# Patient Record
Sex: Female | Born: 1935 | Race: White | Hispanic: No | Smoking: Never smoker
Health system: Southern US, Community
[De-identification: ages and names within clinical notes are randomized; demographics above are authoritative.]

## PROBLEM LIST (undated history)

## (undated) DIAGNOSIS — J849 Interstitial pulmonary disease, unspecified: Secondary | ICD-10-CM

## (undated) DIAGNOSIS — R0989 Other specified symptoms and signs involving the circulatory and respiratory systems: Secondary | ICD-10-CM

## (undated) DIAGNOSIS — W19XXXA Unspecified fall, initial encounter: Secondary | ICD-10-CM

## (undated) DIAGNOSIS — I10 Essential (primary) hypertension: Secondary | ICD-10-CM

## (undated) DIAGNOSIS — R748 Abnormal levels of other serum enzymes: Secondary | ICD-10-CM

## (undated) DIAGNOSIS — E871 Hypo-osmolality and hyponatremia: Secondary | ICD-10-CM

## (undated) DIAGNOSIS — J449 Chronic obstructive pulmonary disease, unspecified: Secondary | ICD-10-CM

## (undated) DIAGNOSIS — S22000A Wedge compression fracture of unspecified thoracic vertebra, initial encounter for closed fracture: Secondary | ICD-10-CM

## (undated) DIAGNOSIS — R42 Dizziness and giddiness: Secondary | ICD-10-CM

## (undated) DIAGNOSIS — I350 Nonrheumatic aortic (valve) stenosis: Secondary | ICD-10-CM

## (undated) DIAGNOSIS — I951 Orthostatic hypotension: Secondary | ICD-10-CM

## (undated) HISTORY — PX: EYE SURGERY: SHX253

## (undated) HISTORY — PX: ABDOMINAL HYSTERECTOMY: SHX81

---

## 2001-12-04 ENCOUNTER — Encounter: Payer: Self-pay | Admitting: Emergency Medicine

## 2001-12-05 ENCOUNTER — Inpatient Hospital Stay (HOSPITAL_COMMUNITY): Admission: EM | Admit: 2001-12-05 | Discharge: 2001-12-10 | Payer: Self-pay | Admitting: Emergency Medicine

## 2003-12-08 DIAGNOSIS — J449 Chronic obstructive pulmonary disease, unspecified: Secondary | ICD-10-CM

## 2003-12-08 HISTORY — DX: Chronic obstructive pulmonary disease, unspecified: J44.9

## 2003-12-10 ENCOUNTER — Encounter: Admission: RE | Admit: 2003-12-10 | Discharge: 2003-12-10 | Payer: Self-pay | Admitting: Family Medicine

## 2005-07-17 ENCOUNTER — Encounter: Admission: RE | Admit: 2005-07-17 | Discharge: 2005-07-17 | Payer: Self-pay | Admitting: Family Medicine

## 2007-06-09 DIAGNOSIS — I350 Nonrheumatic aortic (valve) stenosis: Secondary | ICD-10-CM

## 2007-06-09 DIAGNOSIS — E860 Dehydration: Secondary | ICD-10-CM

## 2007-06-09 DIAGNOSIS — R748 Abnormal levels of other serum enzymes: Secondary | ICD-10-CM

## 2007-06-09 HISTORY — DX: Dehydration: E86.0

## 2007-06-09 HISTORY — DX: Abnormal levels of other serum enzymes: R74.8

## 2007-06-09 HISTORY — DX: Nonrheumatic aortic (valve) stenosis: I35.0

## 2007-06-17 ENCOUNTER — Inpatient Hospital Stay (HOSPITAL_COMMUNITY): Admission: EM | Admit: 2007-06-17 | Discharge: 2007-06-20 | Payer: Self-pay | Admitting: Emergency Medicine

## 2007-06-17 ENCOUNTER — Encounter (INDEPENDENT_AMBULATORY_CARE_PROVIDER_SITE_OTHER): Payer: Self-pay | Admitting: Internal Medicine

## 2010-01-25 ENCOUNTER — Inpatient Hospital Stay (HOSPITAL_COMMUNITY)
Admission: EM | Admit: 2010-01-25 | Discharge: 2010-01-27 | Payer: Self-pay | Source: Home / Self Care | Admitting: Emergency Medicine

## 2010-01-26 ENCOUNTER — Ambulatory Visit: Payer: Self-pay | Admitting: Vascular Surgery

## 2010-01-26 ENCOUNTER — Encounter (INDEPENDENT_AMBULATORY_CARE_PROVIDER_SITE_OTHER): Payer: Self-pay | Admitting: Internal Medicine

## 2010-09-08 ENCOUNTER — Other Ambulatory Visit: Payer: Self-pay

## 2010-09-23 LAB — COMPREHENSIVE METABOLIC PANEL
ALT: 18 U/L (ref 0–35)
AST: 22 U/L (ref 0–37)
Alkaline Phosphatase: 66 U/L (ref 39–117)
BUN: 8 mg/dL (ref 6–23)
CO2: 28 mEq/L (ref 19–32)
CO2: 28 mEq/L (ref 19–32)
Chloride: 92 mEq/L — ABNORMAL LOW (ref 96–112)
Creatinine, Ser: 0.47 mg/dL (ref 0.4–1.2)
Creatinine, Ser: 0.49 mg/dL (ref 0.4–1.2)
GFR calc Af Amer: >60 mL/min (ref >60)
GFR calc non Af Amer: >60 mL/min (ref >60)
Glucose, Bld: 148 mg/dL — ABNORMAL HIGH (ref 70–99)
Potassium: 3.7 mEq/L (ref 3.5–5.1)
Sodium: 127 mEq/L — ABNORMAL LOW (ref 135–145)
Total Bilirubin: 0.7 mg/dL (ref 0.3–1.2)
Total Bilirubin: 0.7 mg/dL (ref 0.3–1.2)
Total Protein: 6.6 g/dL (ref 6.0–8.3)
Total Protein: 7.1 g/dL (ref 6.0–8.3)

## 2010-09-23 LAB — CBC
HCT: 39.2 % (ref 36.0–46.0)
HCT: 41.5 % (ref 36.0–46.0)
Hemoglobin: 14.1 g/dL (ref 12.0–15.0)
MCHC: 34.6 g/dL (ref 30.0–36.0)
Platelets: 230 10*3/uL (ref 150–400)
RBC: 4.25 MIL/uL (ref 3.87–5.11)
RBC: 4.46 MIL/uL (ref 3.87–5.11)
RDW: 13.4 % (ref 11.5–15.5)
RDW: 13.9 % (ref 11.5–15.5)

## 2010-09-23 LAB — LIPID PANEL
Cholesterol: 134 mg/dL (ref 0–200)
LDL Cholesterol: 60 mg/dL (ref 0–99)
Total CHOL/HDL Ratio: 2 RATIO
Triglycerides: 29 mg/dL (ref <150)
VLDL: 6 mg/dL (ref 0–40)

## 2010-09-23 LAB — BASIC METABOLIC PANEL
BUN: 7 mg/dL (ref 6–23)
CO2: 30 mEq/L (ref 19–32)
Calcium: 9.2 mg/dL (ref 8.4–10.5)
Creatinine, Ser: 0.51 mg/dL (ref 0.4–1.2)
GFR calc non Af Amer: >60 mL/min (ref >60)
Glucose, Bld: 111 mg/dL — ABNORMAL HIGH (ref 70–99)

## 2010-09-23 LAB — OSMOLALITY, URINE: Osmolality, Ur: 225 mOsm/kg — ABNORMAL LOW (ref 390–1090)

## 2010-09-23 LAB — TSH: TSH: 1.283 u[IU]/mL (ref 0.350–4.500)

## 2010-09-23 LAB — DIFFERENTIAL
Eosinophils Absolute: 0.1 10*3/uL (ref 0.0–0.7)
Lymphs Abs: 1.4 10*3/uL (ref 0.7–4.0)
Monocytes Absolute: 0.6 10*3/uL (ref 0.1–1.0)

## 2010-09-23 LAB — PROTIME-INR
INR: 1.13 (ref 0.00–1.49)
Prothrombin Time: 14.4 seconds (ref 11.6–15.2)

## 2010-09-23 LAB — GLUCOSE, CAPILLARY
Glucose-Capillary: 121 mg/dL — ABNORMAL HIGH (ref 70–99)
Glucose-Capillary: 131 mg/dL — ABNORMAL HIGH (ref 70–99)
Glucose-Capillary: 131 mg/dL — ABNORMAL HIGH (ref 70–99)
Glucose-Capillary: 147 mg/dL — ABNORMAL HIGH (ref 70–99)
Glucose-Capillary: 98 mg/dL (ref 70–99)

## 2010-09-23 LAB — URINE CULTURE: Culture: NO GROWTH

## 2010-09-23 LAB — CARDIAC PANEL(CRET KIN+CKTOT+MB+TROPI)
CK, MB: 5 ng/mL — ABNORMAL HIGH (ref 0.3–4.0)
Relative Index: INVALID (ref 0.0–2.5)
Troponin I: 0.01 ng/mL (ref 0.00–0.06)

## 2010-09-23 LAB — HEMOGLOBIN A1C: Hgb A1c MFr Bld: 5.8 % — ABNORMAL HIGH (ref <5.7)

## 2010-09-23 LAB — URINALYSIS, ROUTINE W REFLEX MICROSCOPIC
Bilirubin Urine: NEGATIVE
Hgb urine dipstick: NEGATIVE
Hgb urine dipstick: NEGATIVE
Nitrite: NEGATIVE
Protein, ur: NEGATIVE mg/dL
Urobilinogen, UA: 0.2 mg/dL (ref 0.0–1.0)
pH: 6.5 (ref 5.0–8.0)

## 2010-09-23 LAB — POCT CARDIAC MARKERS: Myoglobin, poc: 80.1 ng/mL (ref 12–200)

## 2010-09-23 LAB — HOMOCYSTEINE: Homocysteine: 6.7 umol/L (ref 4.0–15.4)

## 2010-11-21 NOTE — H&P (Signed)
NAMEMANA, HABERL                   ACCOUNT NO.:  [id number]   MEDICAL RECORD NO.:  [id number]          PATIENT TYPE:  EMS   LOCATION:  ED                           FACILITY:  [facility]   PHYSICIAN:  Hollice Espy, M.D.DATE OF BIRTH:  1913/11/24   DATE OF ADMISSION:  06/17/2007  DATE OF DISCHARGE:                              HISTORY & PHYSICAL   PRIMARY CARE PHYSICIAN:  Jethro Bastos, M.D.   CHIEF COMPLAINT:  Fall.   HISTORY OF PRESENT ILLNESS:  The patient is a 77 year old white female  with past medical history of hypertension who has reported the last  these last few months having episodes of dizziness, almost on a daily  basis.  This is not vertigo, just more of a feeling of lightheadedness.  Today, her symptoms were recurring all day, and then in the evening she  simply just passed out, landing on the ground.  She awoke but had some  severe pain in her back, and so she underwent a CT scan of the head  which was negative, but her x-ray of her thoracic spine noted some age  indeterminate compression fractures at T7 and T9.  The patient had lab  work done.  She was found to have a potassium of 3.3, white count of  13.8 with a 79% shift and normal CPK and MB, but thought elevated  troponin was 0.07.  A urinalysis was ordered because of her elevated  white count but it is still pending.  Currently, the patient complains  of some back pain when she moves.  If she does not, she says it does not  feel bad.  She otherwise has no complaints.  She denies any headaches or  vision changes, dysphagia, chest pain, palpitations, shortness of  breath, wheezing, coughing, no abdominal pain.  No hematuria, dysuria.  No constipation, diarrhea.  No focal numbness, weakness or pain.   REVIEW OF SYSTEMS:  As above and are negative.   PAST MEDICAL HISTORY:  Hypertension.   MEDICATIONS:  Half an aspirin in the mornings, p.r.n. Ambien in the  night and Atenolol daily.   ALLERGIES:   PENICILLIN.   SOCIAL HISTORY:  She denies any tobacco, alcohol or drug use.   FAMILY HISTORY:  Noncontributory.   PHYSICAL EXAMINATION:  VITAL SIGNS:  On admission, temperature 96.9,  heart rate 82, blood pressure initially 197/98, now down to 151/75,  respirations 16, O2 saturation 100% on room air.  GENERAL:  She is alert and oriented x3, in no apparent distress.  HEENT: Normocephalic, atraumatic.  Mucous membranes are slightly dry.  She had no carotid bruits.  HEART:  Regular rate and rhythm.  S1, S2, 2/6 systolic ejection murmur.  LUNGS:  Clear to auscultation bilaterally.  ABDOMEN:  Soft, nontender, nondistended, positive bowel sounds.  EXTREMITIES:  No cyanosis, clubbing or edema.  I limited my  musculoskeletal exam because of possibility of a thoracic compression  fracture.   LABORATORY DATA:  CT scan of the head and chest x-ray are negative other  than chronic changes.  T spine is as per  HPI.   Lab Work:  Sodium 132, potassium 3.3, chloride 97, bicarbonate 24, BUN  6, creatinine 0.5, glucose 136, white count 15.8, H&H 14.2 and 41, MCV  92, platelets 213, saturating 97%.  UA pending.  CPK 77.6, MB 3,  troponin I 0.07.  EKG shows normal sinus rhythm.   ASSESSMENT/PLAN:  1. Syncope and fall.  Etiology is immediately unclear.  The patient      has some problems with being orthostatic, perhaps secondary to      dehydration from infection versus cardiac event giving elevated      troponins, although, etiology is not clear.  Given the fact that      initially she has had episodes of lightheadedness going on for      several months.  Plan to place on telemetry bed, gently hydrate and      continue to monitor.  Will get physical therapy involved after she      is cleared and to resume her compression fractures.  2. Compression fractures, check an MRI, possible interventional      radiology consult for kyphoplasty.  3. Hypertension.  Continue Atenolol.  4. Hyperkalemia. Will  replace.  5. Minimally elevated troponin.  Recheck subsequent sets.  Place on      telemetry.      Hollice Espy, M.D.  Electronically Signed     SKK/MEDQ  D:  06/17/2007  T:  06/17/2007  Job:  [id number]   cc:   Jethro Bastos, M.D.  Fax: [phone]

## 2010-11-21 NOTE — Discharge Summary (Signed)
[name], [name]                   ACCOUNT NO.:  [id number]   MEDICAL RECORD NO.:  [id number]          PATIENT TYPE:  INP   LOCATION:  1411                         FACILITY:  [facility]   PHYSICIAN:  Ramiro Harvest, MD    DATE OF BIRTH:  01/13/1914   DATE OF ADMISSION:  06/16/2007  DATE OF DISCHARGE:  06/20/2007                               DISCHARGE SUMMARY   ATTENDING PHYSICIAN:  Ramiro Harvest, MD.  The patient's primary care  physician is Dr. Marny Lowenstein of [facility].   DISCHARGE DIAGNOSES:  1. Dizziness and fall.  2. Orthostasis.  3. Hypokalemia.  4. Hypovolemic hyponatremia.  5. Hypertension.  6. Acute/subacute T9 compression fracture.  7. Elevated cardiac enzymes.  8. Bilateral carotid bruits.  9. Osteoporosis.   DISCHARGE MEDICATIONS:  1. Norvasc 2.5 mg p.o. daily.  2. Tylenol 650 mg p.o. q.4 h p.r.n. pain.  3. Aspirin 81 mg daily.  4. Colace 100 mg daily.  5. Ambien 5 mg p.o. daily at bedtime p.r.n.   DISPOSITION AND FOLLOWUP:  The patient will be discharged home with home  PT/OT aid.  The patient is asked to call to schedule a followup  appointment with her primary care physician in 2 weeks.  On followup the  patient's blood pressure needs to be reassessed.  The patient's beta-  blocker has been stopped and the patient is not to resume on any beta-  blockers.  Instead the patient was placed on Norvasc 2.5 mg daily.  That  can be titrated up to optimize the patient's blood pressure.  Basic  metabolic profile needs to also be checked to monitor the patient's  electrolytes especially her sodium and her potassium.   CONSULTATIONS:  A cardiology consult was done.  The patient was seen by  Dr. Catalina Gravel on June 17, 2007.   PROCEDURES PERFORMED:  1. CT of the head was performed on June 17, 2007.  CT of the head      without contrast showed no evidence of acute intracranial      abnormality, atrophy and chronic small vessel white matter ischemic  changes.  Plain films of the thoracic spine was done on June 16, 2007, that showed no acute disease with calcified pleural plaque      noted thoracic spine age indeterminate T7 and T9 compression      fractures without bony retropulsion identified.  Bilateral ribs      showed no acute findings.  Chest x-ray as stated above.  Also      June 16, 2007, showed no acute disease with calcified pleural      plaque noted.  MRI of the T spine was done on June 17, 2007,      which showed an acute or subacute T9 compression fracture with no      associated spinal stenosis, chronic compression fractures of T7 and      L4, chronic Schmorl's nodes at T10 and T11, cervical degenerative      changes including grade I arterial listhesis of C6 on C7 with  disk      and ligamentum flavum degeneration results and a mild spinal      stenosis.  2-D echo was also obtained on June 17, 2007, which      showed a EF of 60%, systolic function was overall normal.  No      diagnostic evidence of left ventricular regional wall motion      abnormalities.  Aortic valve thickness moderately increased, mildly      reduced aortic valve leaflet excursion, findings consistent with      very mild aortic valve stenosis, moderate mitral valve      regurgitation, mild to moderate tricuspid valve regurgitation,      estimated peak right ventricular systolic pressure was within the      upper limits of normal.   ADMISSION HISTORY AND PHYSICAL:  [name] is a 77 year old white  female with past medical history of hypertension who had reported that  the last few months she had been having episodes of dizziness almost on  a daily basis.  This is not very vertigo, just more a feeling of  lightheadedness.  On the day of admission the patient stated that her  symptoms were occurring all day and in the evening she tripped and fell.  The patient denies passing out or any syncope.  The patient awoke but  had some  severe pain in her back and she had again a CT scan of the head  which was negative.  X-rays of the thoracic spine noted some age  indeterminate compression fractures at T7 and T9.  The patient had some  lab work done.  The patient was found to have a potassium of 3.3, white  count of 13.8, normal CPK and MB but elevated troponins.  Urinalysis was  ordered which was pending at the time of admission.  At the time of  admission the patient had complained of some back pain on movement.  The  patient, however, stated that she did not feel too bad.  The patient had  no other complaints.  The patient denied any headache, visual changes,  no dysphagia, no chest pain, palpitation, no shortness of breath, no  wheezing, no coughing, no abdominal pain, no hematuria, no dysuria, no  constipation, no diarrhea, no focal numbness, weakness or pain.   PHYSICAL EXAM:  VITAL SIGNS:  Temperature 96.9, pulse of 82, blood  pressure 197/98 which decreased to 151/75, respirations 16, satting 100%  on room air.  GENERAL:  The patient is alert and oriented x3, in no apparent distress.  HEENT: Normocephalic, atraumatic.  Mucous membranes slightly dry.  Positive carotid bruits.  NECK:  Neck was supple.  No lymphadenopathy or edema.  RESPIRATORY:  Lungs were clear to auscultation bilaterally.  No wheezes,  no rhonchi.  CARDIOVASCULAR:  Regular rate and rhythm with a 2/6 systolic ejection  murmur.  Normal S1-S2.  ABDOMEN:  Soft, nontender, nondistended, positive bowel sounds.  No  cyanosis, clubbing, no edema.  MUSCULOSKELETAL:  Exam limited secondary to compression fractures.   DATA:  On admission x-rays, CT scan of the head and chest x-ray were  negative with chronic changes and the rest of the films as stated above.  On admission labs CBC - white count 13.8, hemoglobin 14.2, hematocrit  41.3, platelet count 213, ANC of 10.9.  Basic metabolic profile, sodium  132, potassium 3.3, chloride 97, bicarb 24, BUN 6,  creatinine 0.55,  glucose of 176, calcium of 9.2.  Point of care  markers CK-MB of 3.0,  troponin of 0.07, myoglobin of 77.6.  Urinalysis was yellow, cloudy,  specific gravity of 1.008, pH of 7.5.  Urine glucose negative, bilirubin  negative, ketones 15, blood small, protein negative, urobilinogen 0.2,  nitrite negative, leukocytes negative.  Microscopy 3-6 red blood cells,  few bacteria.   HOSPITAL COURSE:  1. Dizziness and fall.  CT of the head was obtained with results as      stated above which were negative.  The patient had no episodes of      dizziness during the hospitalization.  She was placed on IV fluids.      Orthostatics were checked.  The patient was found to be      orthostatic.  The patient's beta-blocker was discontinued and the      patient was maintained on IV fluids.  The patient was asymptomatic      throughout the hospitalization and we felt the patient's dizziness      was secondary to a combination of orthostasis and beta-blocker.      The patient's beta-blocker was as such discontinued and the patient      was placed on Norvasc instead for hypertension.  The patient had no      other episodes of dizziness throughout the hospitalization.  The      patient will be discharged in a stable and improved condition.  2. Orthostasis.  With the patient's presentation orthostatics were      checked.  The patient was found to be orthostatic.  The patient was      fluid resuscitated with normal saline IV fluids.  The patient's      beta-blocker was discontinued.  Also statins were rechecked and      they were normal.  The patient was asymptomatic throughout the      hospitalization.  The patient will be discharged in a stable and      improved condition.  3. Elevated cardiac enzymes.  The patient had had an elevated troponin      in the setting of dizziness and initially was felt the patient      might have had a syncope but on questioning the patient the patient      denied  any loss of consciousness and the patient just stated that      she had just felt dizzy and tripped and fell.  The patient's      elevated cardiac enzymes initially prompted the patient to be      placed on aspirin, nitro, Lovenox and an EKG was obtained.  A 2-D      echo was also obtained with results as stated above.  The patient      was maintained on atenolol during this time and a cardiology      consult was obtained.  Cardiology came by and saw the patient.  As      the patient was orthostatic her beta-blocker was discontinued and      Norvasc was used instead for her blood pressure.  The patient had      adamantly refused any procedures to be done including a stress test      or a cath.  The patient stated that she was older than 89 and as      such does not want any procedures to be done on her at that point      in time.  It was just felt that the patient would be continued  on      aspirin and she will stop her beta-blocker and just medical      management and just monitor.  The patient's Lovenox and nitro were      then discontinued.  The patient remained stable and asymptomatic      throughout the hospitalization.  4. Hypovolemic hyponatremia.  The patient initially presented with a      sodium of 132 which decreased to 129 and it was felt that this was      secondary to volume depletion secondary to orthostasis.  The      patient was resuscitated with IV fluids and the patient's sodium      improved and on the day of discharge was back at 132 from 129.  The      patient was urged to maintain a good hydration and oral intake.      The patient was asymptomatic throughout the hospitalization.  The      patient will be discharged in a stable and improved condition.  5. Hypokalemia.  The patient had a hypokalemia.  The patient's      potassium was repleted and this resolved.  6. Hypertension.  The patient is on atenolol which was her home dose      for her blood pressure was  discontinued secondary to her dizziness      and orthostasis.  The patient was then started on the Norvasc 2.5      mg daily.  The patient seemed to respond initially to the Norvasc      2.5 mg daily.  We will defer further titration of the Norvasc to      the patient's primary care physician on followup.  On followup the      patient's blood pressure needs to be reassessed and Norvasc can be      titrated further for better blood pressure control.  We would      recommend staying away from beta-blockers secondary to the      patient's history of dizziness and orthostasis.  7. Acute/subacute T9 compression fracture.  These were initially seen      on plain films.  MRI was recommended for better visualization as      well as the age of the compression fractures.  MRI was obtained      with results as stated above.  It was felt the patient had an      acute/subacute T9 compression fracture.  The patient had back pain      on presentation and during the hospitalization the patient felt her      back pain had resolved.  It was offered to the patient to get an      interventional radiology consult for possible kyphoplasty but the      patient again refused this procedure, stating that she did not want      any more procedures done after she turned the age of 86.  As such      thoracolumbar brace was initially ordered.  The brace was placed on      the patient.  The patient felt it was too heavy on ambulation then      a thoracolumbar corset was also brought by for the patient to try.      The patient tried it and refused it as well.  The patient stated      her back was feeling better and as such we will just continue pain  management for the patient's compression fracture.  The patient      stated that she only wanted to take Tylenol as needed for her back      pain.  The patient will be discharged home on Tylenol.  The patient      will be discharged home in a stable and improved  condition to      follow up with PCP in terms of her back pain.  8. Bilateral carotid bruits.  The patient stated that she did not want      any procedures done and as such bilateral carotid bruits will just      be monitored.  The patient even refused to get carotid Dopplers.      The rest of the patient's chronic medical issues were stable      throughout the hospitalization.  The patient will be discharged in      a stable and improved condition.   On the day of discharge vital signs - temperature 98.1, pulse of 79,  respirations 17, blood pressure ranged from 129-154 over 70-83, satting  95% on room air.  Labs on day of discharge, sodium 132, potassium 3.6,  chloride 101, bicarb 26, BUN 6, creatinine 0.46, glucose 115, calcium of  8.6.   It has been a pleasure taking care of [name].      Ramiro Harvest, MD  Electronically Signed     DT/MEDQ  D:  06/20/2007  T:  06/20/2007  Job:  [id number]   cc:   Jethro Bastos, M.D.  Fax: [phone]   Corky Crafts, MD  Fax: [phone]

## 2010-11-21 NOTE — Consult Note (Signed)
[name], [name]                   ACCOUNT NO.:  [id number]   MEDICAL RECORD NO.:  [id number]          PATIENT TYPE:  INP   LOCATION:  1411                         [facility]AC[facility]   PHYSICIAN:  Corky Crafts, MDDATE OF BIRTH:  01-28-1914   DATE OF CONSULTATION:  06/17/2007  DATE OF DISCHARGE:                                 CONSULTATION   CARDIOLOGY CONSULTATION   PRIMARY CARDIOLOGIST:  Armanda Magic, M.D.   PRIMARY CARE PHYSICIAN:  Jethro Bastos, M.D.   REASON FOR CONSULTATION:  1. Question syncope.  2. Dizziness.  3. Abnormal cardiac enzymes.  4. High blood pressure.  5. Murmur.   HISTORY OF PRESENT ILLNESS:  The patient is a 77 year old woman with no  known coronary artery disease who had syncope back in 2003; the etiology  was never determined.  She is now admitted after a fall.  The patient  states that she has chronic dizziness, sometimes she is more dizzy with  standing, although this is not her definitive pattern.  Before admission  today she said she stood up and got tangled in something and fell to  the floor.  Apparently at the time she turned quickly to turn off the  light and ended up falling.  She denies passing out.  She did not lose  consciousness.  She remembers the entire episode.  She stayed on the  floor for about 5 minutes, then she got up and then called EMS.  She did  not have any chest pain, palpitations, or shortness of breath.  She  has  not had any orthopnea, PND, or syncope.   ALLERGIES:  PENICILLIN.   MEDICATIONS:  1. Aspirin.  2. Ambien.  3. Atenolol 25 mg daily.  4. Colace.   SOCIAL HISTORY:  She does not smoke.  She does not drink.  She does not  use any illicit drugs.  She lives alone.  Her daughter lives nearby.   FAMILY HISTORY:  Father died of some type of cancer.  Her mother had  heart failure.   PAST MEDICAL HISTORY:  Hypertension.   REVIEW OF SYSTEMS:  As noted above.  No fevers, no chills, no weight  loss, no  nausea, vomiting, no diarrhea.  No focal weakness, no rash.  All other systems negative.   PHYSICAL EXAMINATION:  VITAL SIGNS:  Blood pressure is 148/81,  respiratory rate 20, pulse of 89.  Orthostatic blood pressure's show  lying down 138/68 with a heart rate of 70, sitting 121/70 with a heart  rate of 68 and standing 110/68 with a heart rate of 71.  HEENT:  Head normocephalic, atraumatic.  Eyes:  Extraocular movements  intact.  NECK:  Bilateral carotid bruits.  CARDIOVASCULAR:  2/6 systolic murmur ending in mid systole, preserved  S2.  Regular rate and rhythm.  S1, S2.  LUNGS:  Clear to auscultation bilaterally.  ABDOMEN:  Soft, nontender.  EXTREMITIES:  Showed no edema.  NEUROLOGICAL:  No focal deficits.  SKIN:  No rash.  PSYCHIATRIC:  Normal mood and affect.   LABORATORY DATA:  White count 13.8.  Potassium 3.3.  Initial troponin  0.07, next full set of enzymes showed a total CK 198, MB of 7.4,  troponin of 0.16.  X-ray showed no active disease on the chest x-ray.  CT scan showed chronic microvascular white matter disease.  MRI showed  an acute T9 compression fracture.   ASSESSMENT:  The patient is status post fall, now with compression  fracture and back pain where she fell.   PLAN:  1. CARDIAC:  She did not pass out. She really did not have any chest      pain.  I doubt this is a primary cardiac issue.  We will check an      echocardiogram to evaluate her murmur.  Based on exam, I do not      think she has severe aortic stenosis.  2. She is mildly orthostatic, although her heart rate does not change.      We will keep an eye on her blood pressure.  She may need to come      off of her Atenolol depending upon how her blood pressure      stabilizes.  3. Would continue to follow enzymes.  The patient has had Lovenox      ordered.  I doubt this is an acute coronary syndrome.  If the next      set of cardiac enzymes are similar to the last, I would stop the      Lovenox.  4.  Patient with bilateral carotid bruits.  This may be her murmur      radiated up to the carotid's.  5. Continue telemetry as well.      Corky Crafts, MD  Electronically Signed     JSV/MEDQ  D:  06/17/2007  T:  06/18/2007  Job:  [id number]   cc:   Jethro Bastos, M.D.  Fax: [phone]   Armanda Magic, M.D.  Fax: [phone]

## 2010-11-24 NOTE — Discharge Summary (Signed)
[facility]  Patient:    [name], [name] Visit Number: [id number] MRN: [id number]          Service Type: MED Location: [id number] Attending Physician:  Gracelyn Nurse Dictated by:   Gracelyn Nurse, M.D. Admit Date:  12/04/2001 Discharge Date: 12/10/2001   CC:         Jethro Bastos, M.D.  Florencia Reasons, M.D.   Discharge Summary  DISCHARGE DIAGNOSES: 1. Syncope.    a. Etiology undetermined.    b. A 2-D echocardiogram showed an ejection fraction of 50%-55%, normal       aortic valve leaflets, mild mitral regurgitation.    c. CT of the head which showed small left frontal scalp hematoma.    d. May be secondary to dehydration. 2. Fall.    a. Small left frontal scalp hematoma. 3. Iron-deficient anemia.    a. Stools guaiac negative.    b. Possibly secondary to frequent phlebotomies.  DISCHARGE MEDICATIONS: 1. Aspirin 81 mg q.d. 2. Iron sulfate 325 mg t.i.d.  PROCEDURES: 1. CT of the head, which just showed a small left frontal scalp hematoma. 2. A 2-D echocardiogram, which showed ejection fraction of 50%-55% and mild    mitral regurgitation.  No aortic stenosis.  ADMITTING HISTORY AND PHYSICAL:  This is an 77 year old white female with no history of chronic medical problems.  She fell earlier today while moving plants.  She reported feeling dizzy afterwards.  Later in the day, while walking down the stairs, she passed out and hit her head.  She does not know how long she was out.  HOSPITAL COURSE: #1 - SYNCOPE:  The patient was admitted.  At her syncopal episode, she was mildly dehydrated.  She was given IV fluids.  She had a head CT, which was unremarkable with the above results.  She had no neurologic deficits.  A 2-D echocardiogram showed above results and did not account for the syncope.  The morning after admission, when orthostatic blood pressures were being checked, she was found to be orthostatic and actually got bradycardic  after being set up.  She became orthostatic first and had a syncopal spell here in the hospital.  Once she was fully rehydrated, there were no more episodes of syncopal spells.  It was thought that this could likely be due to her dehydration.  #2 - IRON-DEFICIENCY ANEMIA:  Admitting hemoglobin was 12.  The next hospital day, it was 9.1 after IV hydration.  It fell as low as 7.9 and was 8.5 at discharge.  She was found to be iron deficient and started on iron supplement. Her stools did not show any occult blood.  The patient was anxious to go home and seemed to be stable.  I talked with Dr. Matthias Hughs at [facility] and he agreed to see her as an outpatient to set up a colonoscopy to further evaluate this. She was also started on iron supplementation and her hemoglobin levels will be followed up as an outpatient.  #3 - DEHYDRATION:  The patient appeared to be dehydrated on admission.  She was resuscitated with IV fluids and responded appropriately and, as noted above, did not have any more syncopal spells following this.  DISCHARGE LABORATORY DATA:  Hemoglobin is 8.5.  Sodium is 136, potassium 3.4, chloride 105, CO2 28, BUN 4, creatinine 0.5, and glucose 116.  DISPOSITION:  The patient is discharged in stable condition and will follow up with both Dr. Dorothe Pea, her primary care physician,  and Dr. Matthias Hughs, a GI specialist.  FOLLOWUP:  The patient needs to have her hemoglobin rechecked to make sure it is stable. Dictated by:   Gracelyn Nurse, M.D. Attending Physician:  Marcelino Duster D DD:  12/10/01 TD:  12/12/01 Job: [id number]

## 2011-04-16 LAB — BASIC METABOLIC PANEL
BUN: 6
CO2: 24
CO2: 26
Calcium: 9.2
Chloride: 101
Chloride: 97
Chloride: 97
Chloride: 98
Creatinine, Ser: 0.41
Creatinine, Ser: 0.46
Creatinine, Ser: 0.55
GFR calc Af Amer: >60
GFR calc Af Amer: >60
GFR calc Af Amer: >60
Glucose, Bld: 176 — ABNORMAL HIGH
Potassium: 3.6
Potassium: 4.3
Sodium: 129 — ABNORMAL LOW

## 2011-04-16 LAB — URINALYSIS, ROUTINE W REFLEX MICROSCOPIC
Bilirubin Urine: NEGATIVE
Glucose, UA: NEGATIVE
Protein, ur: NEGATIVE

## 2011-04-16 LAB — URINE MICROSCOPIC-ADD ON

## 2011-04-16 LAB — CBC
HCT: 35.3 — ABNORMAL LOW
MCHC: 34.3
MCV: 91.6
MCV: 91.9
Platelets: 166
Platelets: 213
RBC: 3.84 — ABNORMAL LOW
RBC: 4.51
RDW: 13.6
WBC: 7.2
WBC: 7.9

## 2011-04-16 LAB — DIFFERENTIAL
Basophils Absolute: 0
Basophils Relative: 0
Eosinophils Absolute: 0 — ABNORMAL LOW
Monocytes Relative: 7
Neutrophils Relative %: 79 — ABNORMAL HIGH

## 2011-04-16 LAB — CARDIAC PANEL(CRET KIN+CKTOT+MB+TROPI)
Relative Index: 3.7 — ABNORMAL HIGH
Relative Index: 3.7 — ABNORMAL HIGH
Troponin I: 0.16 — ABNORMAL HIGH

## 2011-04-16 LAB — POCT CARDIAC MARKERS: Myoglobin, poc: 77.6

## 2011-06-04 ENCOUNTER — Emergency Department (HOSPITAL_COMMUNITY): Payer: Medicare Other

## 2011-06-04 ENCOUNTER — Encounter: Payer: Self-pay | Admitting: *Deleted

## 2011-06-04 ENCOUNTER — Inpatient Hospital Stay (HOSPITAL_COMMUNITY)
Admission: EM | Admit: 2011-06-04 | Discharge: 2011-06-09 | DRG: 194 | Disposition: A | Discharge status: Home Health | Payer: Medicare Other | Attending: Internal Medicine | Admitting: Internal Medicine

## 2011-06-04 DIAGNOSIS — S22000A Wedge compression fracture of unspecified thoracic vertebra, initial encounter for closed fracture: Secondary | ICD-10-CM | POA: Diagnosis present

## 2011-06-04 DIAGNOSIS — E871 Hypo-osmolality and hyponatremia: Secondary | ICD-10-CM | POA: Diagnosis present

## 2011-06-04 DIAGNOSIS — E236 Other disorders of pituitary gland: Secondary | ICD-10-CM | POA: Diagnosis present

## 2011-06-04 DIAGNOSIS — E876 Hypokalemia: Secondary | ICD-10-CM | POA: Diagnosis present

## 2011-06-04 DIAGNOSIS — N390 Urinary tract infection, site not specified: Secondary | ICD-10-CM | POA: Diagnosis present

## 2011-06-04 DIAGNOSIS — A498 Other bacterial infections of unspecified site: Secondary | ICD-10-CM | POA: Diagnosis present

## 2011-06-04 DIAGNOSIS — Z87311 Personal history of (healed) other pathological fracture: Secondary | ICD-10-CM

## 2011-06-04 DIAGNOSIS — M810 Age-related osteoporosis without current pathological fracture: Secondary | ICD-10-CM | POA: Diagnosis present

## 2011-06-04 DIAGNOSIS — J189 Pneumonia, unspecified organism: Principal | ICD-10-CM | POA: Diagnosis present

## 2011-06-04 DIAGNOSIS — I10 Essential (primary) hypertension: Secondary | ICD-10-CM | POA: Diagnosis present

## 2011-06-04 HISTORY — DX: Orthostatic hypotension: I95.1

## 2011-06-04 HISTORY — DX: Wedge compression fracture of unspecified thoracic vertebra, initial encounter for closed fracture: S22.000A

## 2011-06-04 HISTORY — DX: Dizziness and giddiness: R42

## 2011-06-04 HISTORY — DX: Hypo-osmolality and hyponatremia: E87.1

## 2011-06-04 HISTORY — DX: Other specified symptoms and signs involving the circulatory and respiratory systems: R09.89

## 2011-06-04 HISTORY — DX: Interstitial pulmonary disease, unspecified: J84.9

## 2011-06-04 HISTORY — DX: Essential (primary) hypertension: I10

## 2011-06-04 HISTORY — DX: Chronic obstructive pulmonary disease, unspecified: J44.9

## 2011-06-04 HISTORY — DX: Unspecified fall, initial encounter: W19.XXXA

## 2011-06-04 HISTORY — DX: Nonrheumatic aortic (valve) stenosis: I35.0

## 2011-06-04 HISTORY — DX: Abnormal levels of other serum enzymes: R74.8

## 2011-06-04 LAB — DIFFERENTIAL
Eosinophils Absolute: 0 10*3/uL (ref 0.0–0.7)
Eosinophils Relative: 0 % (ref 0–5)
Lymphs Abs: 1.3 10*3/uL (ref 0.7–4.0)
Monocytes Absolute: 1 10*3/uL (ref 0.1–1.0)
Monocytes Relative: 7 % (ref 3–12)

## 2011-06-04 LAB — OSMOLALITY, URINE: Osmolality, Ur: 477 mOsm/kg (ref 390–1090)

## 2011-06-04 LAB — CBC
HCT: 39.4 % (ref 36.0–46.0)
Hemoglobin: 13.5 g/dL (ref 12.0–15.0)
MCH: 30.9 pg (ref 26.0–34.0)
MCV: 88.9 fL (ref 78.0–100.0)
MCV: 90 fL (ref 78.0–100.0)
Platelets: 436 10*3/uL — ABNORMAL HIGH (ref 150–400)
Platelets: 437 10*3/uL — ABNORMAL HIGH (ref 150–400)
RBC: 4.34 MIL/uL (ref 3.87–5.11)
RBC: 4.38 MIL/uL (ref 3.87–5.11)
WBC: 14.5 10*3/uL — ABNORMAL HIGH (ref 4.0–10.5)

## 2011-06-04 LAB — PROTIME-INR
INR: 1.16 (ref 0.00–1.49)
Prothrombin Time: 15 seconds (ref 11.6–15.2)

## 2011-06-04 LAB — BASIC METABOLIC PANEL
BUN: 8 mg/dL (ref 6–23)
Calcium: 8.7 mg/dL (ref 8.4–10.5)
Creatinine, Ser: 0.44 mg/dL — ABNORMAL LOW (ref 0.50–1.10)
GFR calc non Af Amer: 83 mL/min — ABNORMAL LOW (ref >90)
Glucose, Bld: 140 mg/dL — ABNORMAL HIGH (ref 70–99)
Sodium: 127 mEq/L — ABNORMAL LOW (ref 135–145)

## 2011-06-04 LAB — URINALYSIS, ROUTINE W REFLEX MICROSCOPIC
Leukocytes, UA: NEGATIVE
Nitrite: NEGATIVE
Protein, ur: NEGATIVE mg/dL
Urobilinogen, UA: 0.2 mg/dL (ref 0.0–1.0)

## 2011-06-04 LAB — SODIUM, URINE, RANDOM: Sodium, Ur: 98 mEq/L

## 2011-06-04 LAB — MAGNESIUM: Magnesium: 1.7 mg/dL (ref 1.5–2.5)

## 2011-06-04 LAB — CREATININE, SERUM: GFR calc Af Amer: >90 mL/min (ref >90)

## 2011-06-04 MED ORDER — ALUM & MAG HYDROXIDE-SIMETH 200-200-20 MG/5ML PO SUSP: 30.0000 mL | Freq: Four times a day (QID) | ORAL | Status: DC | PRN

## 2011-06-04 MED ORDER — ACETAMINOPHEN 325 MG PO TABS: 650.0000 mg | ORAL_TABLET | Freq: Four times a day (QID) | ORAL | Status: DC | PRN

## 2011-06-04 MED ORDER — ACETAMINOPHEN 650 MG RE SUPP: 650.0000 mg | Freq: Four times a day (QID) | RECTAL | Status: DC | PRN

## 2011-06-04 MED ORDER — LEVOFLOXACIN IN D5W 750 MG/150ML IV SOLN
750.0000 mg | INTRAVENOUS | Status: DC
  Administered 2011-06-04 – 2011-06-05 (×2): 750 mg via INTRAVENOUS
  Filled 2011-06-04 (×3): qty 150

## 2011-06-04 MED ORDER — ONDANSETRON HCL 4 MG/2ML IJ SOLN: 4.0000 mg | Freq: Four times a day (QID) | INTRAMUSCULAR | Status: DC | PRN

## 2011-06-04 MED ORDER — SENNOSIDES-DOCUSATE SODIUM 8.6-50 MG PO TABS
1.0000 | ORAL_TABLET | Freq: Every day | ORAL | Status: DC | PRN
  Filled 2011-06-04: qty 1

## 2011-06-04 MED ORDER — ONDANSETRON HCL 4 MG PO TABS: 4.0000 mg | ORAL_TABLET | Freq: Four times a day (QID) | ORAL | Status: DC | PRN

## 2011-06-04 MED ORDER — DEXTROSE 5 % IV SOLN
500.0000 mg | Freq: Once | INTRAVENOUS | Status: AC
  Administered 2011-06-04: 500 mg via INTRAVENOUS
  Filled 2011-06-04: qty 500

## 2011-06-04 MED ORDER — AMLODIPINE BESYLATE 5 MG PO TABS
5.0000 mg | ORAL_TABLET | Freq: Every day | ORAL | Status: AC
  Administered 2011-06-04: 5 mg via ORAL
  Filled 2011-06-04: qty 1

## 2011-06-04 MED ORDER — SODIUM CHLORIDE 0.9 % IV SOLN
INTRAVENOUS | Status: DC
  Administered 2011-06-04 – 2011-06-05 (×3): via INTRAVENOUS

## 2011-06-04 MED ORDER — ASPIRIN EC 81 MG PO TBEC
81.0000 mg | DELAYED_RELEASE_TABLET | Freq: Every day | ORAL | Status: DC
  Administered 2011-06-05 – 2011-06-09 (×5): 81 mg via ORAL
  Filled 2011-06-04 (×6): qty 1

## 2011-06-04 MED ORDER — DEXTROSE 5 % IV SOLN
1.0000 g | Freq: Once | INTRAVENOUS | Status: AC
  Administered 2011-06-04: 1 g via INTRAVENOUS
  Filled 2011-06-04: qty 10

## 2011-06-04 MED ORDER — DOCUSATE SODIUM 100 MG PO CAPS
100.0000 mg | ORAL_CAPSULE | Freq: Two times a day (BID) | ORAL | Status: DC
  Administered 2011-06-04 – 2011-06-08 (×6): 100 mg via ORAL
  Filled 2011-06-04 (×12): qty 1

## 2011-06-04 MED ORDER — OXYCODONE HCL 5 MG PO TABS: 5.0000 mg | ORAL_TABLET | ORAL | Status: DC | PRN

## 2011-06-04 MED ORDER — IPRATROPIUM BROMIDE 0.02 % IN SOLN: 0.5000 mg | RESPIRATORY_TRACT | Status: DC | PRN

## 2011-06-04 MED ORDER — DM-GUAIFENESIN ER 30-600 MG PO TB12
1.0000 | ORAL_TABLET | Freq: Two times a day (BID) | ORAL | Status: DC
  Administered 2011-06-04 – 2011-06-06 (×4): 1 via ORAL
  Filled 2011-06-04 (×6): qty 1

## 2011-06-04 MED ORDER — ZOLPIDEM TARTRATE 5 MG PO TABS
5.0000 mg | ORAL_TABLET | Freq: Every evening | ORAL | Status: DC | PRN
  Administered 2011-06-05: 5 mg via ORAL
  Filled 2011-06-04: qty 1

## 2011-06-04 MED ORDER — AMLODIPINE BESYLATE 5 MG PO TABS
5.0000 mg | ORAL_TABLET | Freq: Every day | ORAL | Status: DC
  Administered 2011-06-05 – 2011-06-09 (×5): 5 mg via ORAL
  Filled 2011-06-04 (×6): qty 1

## 2011-06-04 MED ORDER — SODIUM CHLORIDE 0.9 % IV BOLUS (SEPSIS)
500.0000 mL | Freq: Once | INTRAVENOUS | Status: AC
  Administered 2011-06-04: 500 mL via INTRAVENOUS

## 2011-06-04 MED ORDER — ALBUTEROL SULFATE (5 MG/ML) 0.5% IN NEBU: 2.5000 mg | INHALATION_SOLUTION | RESPIRATORY_TRACT | Status: DC | PRN

## 2011-06-04 MED ORDER — ENOXAPARIN SODIUM 40 MG/0.4ML ~~LOC~~ SOLN
40.0000 mg | SUBCUTANEOUS | Status: DC
  Administered 2011-06-04 – 2011-06-08 (×5): 40 mg via SUBCUTANEOUS
  Filled 2011-06-04 (×7): qty 0.4

## 2011-06-04 MED ORDER — LISINOPRIL 10 MG PO TABS
10.0000 mg | ORAL_TABLET | Freq: Once | ORAL | Status: AC
  Administered 2011-06-04: 10 mg via ORAL
  Filled 2011-06-04: qty 1

## 2011-06-04 MED ORDER — SODIUM CHLORIDE 0.9 % IV SOLN
Freq: Once | INTRAVENOUS | Status: AC
  Administered 2011-06-04: 08:00:00 via INTRAVENOUS

## 2011-06-04 NOTE — ED Notes (Signed)
EDP at bedside  

## 2011-06-04 NOTE — Research (Signed)
Pt's BP 181/84, pulse 87.  MD on call notified.  Order received for Norvasc 5mg  tonight.  Will continue to monitor BP. Newman Nip [place]

## 2011-06-04 NOTE — ED Notes (Signed)
Pt c/o cough, congestion, chest pain w/ cough and deep inspiration. Pt presents w/ red, puffy, draining eyes. Pt ambulatory w/ cane, oriented x 4.

## 2011-06-04 NOTE — H&P (Signed)
[name] MRN: [id number] DOB/AGE: [birth year] 77 y.o. Primary Care Physician:No primary provider on file. Admit date: 06/04/2011 Chief Complaint: cough HPI:  Ms [name] is a pleasant 77 year old Caucasian female with history of hypertension, history of hypovolemic hyponatremia, history of orthostasis, history of osteoporosis who presents to the ED with a four-day history of a productive cough. Patient states that had a grandchildren over on Thanksgiving who had coughs and ever since then she has had a productive cough. Patient has had associated chest pain while coughing which is relieved after cough is over. Patient denies any fevers, no shortness of breath, no chills, no palpitations, no nausea, no vomiting, no diarrhea, no constipation, no weakness, no dysuria, no other associated symptoms. Patient was seen in the emergency room, chest x-ray which was done was consistent with a pneumonia. CBC obtained did show a leukocytosis with a white count of 14.7 , a be met with a sodium of 127 . We were called to admit the patient for further evaluation and management.  Past Medical History  Diagnosis Date  . Hypertension   . Compression fracture of thoracic vertebra   . Osteoporosis   . Carotid artery bruit   . Orthostasis   . Aortic stenosis, mild 06/2007    per echo of 06/17/2007  . COPD (chronic obstructive pulmonary disease) 12/2003    per CT chest 12/10/2003  . Interstitial lung disease     per CT of 12/10/2003  . Dizziness   . Fall   . Dehydration with hyponatremia 06/2007  . Elevation of cardiac enzymes 06/2007    Past Surgical History  Procedure Date  . Abdominal hysterectomy   . Eye surgery     cataract surgery    Prior to Admission medications   Not on File    Allergies:  Allergies  Allergen Reactions  . Penicillins  rash     History reviewed. No pertinent family history.  Social History:  reports that she has never smoked. She does not have any smokeless tobacco history  on file. She reports that she does not drink alcohol or use illicit drugs.  ROS: All systems reviewed with the patient and was positive as per HPI otherwise all other systems are negative.  PHYSICAL EXAM: Blood pressure 150/70, pulse 86, temperature 97.9 F (36.6 C), temperature source Oral, resp. rate 22, SpO2 96.00%. General: Well-developed well-nourished female in no acute cardiopulmonary distress. HEENT: Normal cephalic, atraumatic, PERRLA, EOMI, oropharynx is clear moist no lesions no exudates. Neck is supple with no lymphadenopathy. RESP:  diffuse bilateral coarse breath sounds,  diffuse rhonchi Cardiovascular: Regular rate rhythm, 2/6 systolic ejection murmur Abdomen: Soft, nontender, nondistended, positive bowel sounds. Extremities: No clubbing cyanosis or edema.  No results found for this or any previous visit (from the past 240 hour(s)).   Lab results:  [facility] 06/04/11 0552  NA 127*  K 3.7  CL 90*  CO2 28  GLUCOSE 140*  BUN 8  CREATININE 0.44*  CALCIUM 8.7  MG --  PHOS --   No results found for this basename: AST:2,ALT:2,ALKPHOS:2,BILITOT:2,PROT:2,ALBUMIN:2 in the last 72 hours No results found for this basename: LIPASE:2,AMYLASE:2 in the last 72 hours  Basename 06/04/11 0333  WBC 14.7*  NEUTROABS 12.3*  HGB 13.4  HCT 38.6  MCV 88.9  PLT 436*   No results found for this basename: CKTOTAL:3,CKMB:3,CKMBINDEX:3,TROPONINI:3 in the last 72 hours No results found for this basename: POCBNP:3 in the last 72 hours No results found for this basename: DDIMER in the  last 72 hours No results found for this basename: HGBA1C:2 in the last 72 hours No results found for this basename: CHOL:2,HDL:2,LDLCALC:2,TRIG:2,CHOLHDL:2,LDLDIRECT:2 in the last 72 hours No results found for this basename: TSH,T4TOTAL,FREET3,T3FREE,THYROIDAB in the last 72 hours No results found for this basename: VITAMINB12:2,FOLATE:2,FERRITIN:2,TIBC:2,IRON:2,RETICCTPCT:2 in the last 72 hours Imaging  results:  Dg Chest 2 View  06/04/2011  *RADIOLOGY REPORT*  Clinical Data: Productive cough.  CHEST - 2 VIEW  Comparison: Chest radiograph performed 01/25/2010  Findings: The lungs are well-aerated.  Patchy bilateral airspace opacification is noted, more prominent on the left.  Small bilateral pleural effusions are suspected.  Airspace opacities have a predominantly peripheral distribution, and raise suspicion for pneumonia, though mild underlying vascular congestion is seen.  A calcific pleural plaque at the left upper lung zone is more prominent than on prior studies.  The heart is normal in size; the mediastinal contour is within normal limits.  No acute osseous abnormalities are seen.  IMPRESSION:  1.  New patchy bilateral airspace opacities, more prominent on the left, with likely small bilateral pleural effusions.  Airspace opacities have a predominantly peripheral distribution, raising suspicion for pneumonia.  Suggest repeat chest radiograph after completing treatment of pneumonia to exclude underlying process, as deemed clinically appropriate. 2.  Increased prominence of calcific pleural plaque at the left upper lung zone. 3.  Mild vascular congestion noted.  Original Report Authenticated By: Tonia Ghent, M.D.   Impression/Plan:  Principal Problem:  *PNA (pneumonia) Active Problems:  Hyponatremia  Osteoporosis  Compression fx, thoracic spine  HTN (hypertension)   #1 community acquired pneumonia-patient presents with a cough and elevated white count chest x-ray consistent with a pneumonia. Will admit the patient, check a sputum Gram stain and culture, place on IV Levaquin, Mucinex, nebs as needed, oxygen, supportive care. #2 hyponatremia-patient has had a history of HYPOVOLEMIC hyponatremia, in the past. Will check orthostatics, check a urine and serum osmolality, check a urine sodium, check a urine creatinine, check a TSH. Place on gentle hydration with IV fluids. #3 hypertension-we'll  place patient on Norvasc 5 mg daily. #4 prophylaxis-Lovenox for DVT prophylaxis.   [name] 06/04/2011, 9:21 AM

## 2011-06-04 NOTE — ED Provider Notes (Addendum)
Medical screening examination/treatment/procedure(s) were conducted as a shared visit with non-physician practitioner(s) and myself.  I personally evaluated the patient during the encounter 77 year old, female, with no significant past medical history complains of a productive cough since Thanksgiving, which was 4 days ago.  She denies fevers, chills, or diaphoresis.  She states that she has chest pain with her cough, but otherwise does not have pain anywhere else.  She does not smoke.  She is allergic to no medications.  On examination.  She is mildly tachypneic with rales.  On the left hand side.  We will perform a chest x-ray, and laboratory testing, to look for pneumonia.  Nicholes Stairs, MD 06/04/11 0320   Nicholes Stairs, MD 06/04/11 0960  8:13 AM  I spoke with Dr. Janee Morn.  He accepted the pt. He asked me to give a bp med and admit to  Tele.  Nicholes Stairs, MD 06/04/11 [phone]

## 2011-06-04 NOTE — ED Provider Notes (Signed)
History     CSN: [id number] Arrival date & time: 06/04/2011  1:14 AM   First MD Initiated Contact with Patient 06/04/11 0144      Chief Complaint  Patient presents with  . URI    HPI  History is provided by the patient. Patient is a pleasant 77 year old woman who presents with complaints of cough, congestion and chest tightness. Patient states that she picked up a cold from her grandchildren over Thanksgiving and has been coughing since Friday. Cough is productive with some yellow sputum. This evening patient was up leg sitting on couch when she developed a general discomfort across bilateral lower chest. Symptoms are described as a tightness made worse when she coughed or took deep breaths. Patient denies fever, chills, sweats, nausea, vomiting, diarrhea or abdominal pain. Patient reports having some drainage from the eyes but states this is a chronic problem that she has had prior to the recent symptoms.   History reviewed. No pertinent past medical history.  History reviewed. No pertinent past surgical history.  History reviewed. No pertinent family history.  History  Substance Use Topics  . Smoking status: Never Smoker   . Smokeless tobacco: Not on file  . Alcohol Use: No    OB History    Grav Para Term Preterm Abortions TAB SAB Ect Mult Living                  Review of Systems  Constitutional: Negative for fever, chills and diaphoresis.  HENT: Positive for congestion.   Eyes: Positive for discharge and redness. Negative for pain and visual disturbance.  Respiratory: Positive for cough. Negative for shortness of breath.   Cardiovascular: Positive for chest pain. Negative for palpitations.  Gastrointestinal: Negative for nausea, vomiting, abdominal pain, diarrhea and constipation.  Genitourinary: Negative for dysuria and frequency.  All other systems reviewed and are negative.    Allergies  Penicillins  Home Medications  No current outpatient prescriptions on  file.  BP 129/78  Pulse 97  Temp(Src) 98 F (36.7 C) (Oral)  Resp 18  SpO2 96%  Physical Exam  Nursing note and vitals reviewed. Constitutional: She is oriented to person, place, and time. She appears well-developed and well-nourished.  HENT:  Head: Normocephalic.  Mouth/Throat: Oropharynx is clear and moist.  Eyes: EOM are normal. Pupils are equal, round, and reactive to light.       Bilateral conjunctivae erythematous with slight amounts of purulent drainage. There is evidence of crusting around the eyelashes.  Cardiovascular: Normal rate and regular rhythm.   No murmur heard. Pulmonary/Chest: Effort normal. No respiratory distress. She has no wheezes. She has rales. She exhibits no tenderness.  Abdominal: Soft. She exhibits no distension. There is no tenderness. There is no rebound and no guarding.  Neurological: She is alert and oriented to person, place, and time.  Skin: Skin is warm. No rash noted.  Psychiatric: She has a normal mood and affect. Her behavior is normal.    ED Course  Procedures (including critical care time)  Labs Reviewed  CBC - Abnormal; Notable for the following:    WBC 14.7 (*)    Platelets 436 (*)    All other components within normal limits  DIFFERENTIAL - Abnormal; Notable for the following:    Neutrophils Relative 84 (*)    Neutro Abs 12.3 (*)    Lymphocytes Relative 9 (*)    All other components within normal limits  BASIC METABOLIC PANEL   Results for orders placed during  the hospital encounter of 06/04/11  CBC      Component Value Range   WBC 14.7 (*) 4.0 - 10.5 (K/uL)   RBC 4.34  3.87 - 5.11 (MIL/uL)   Hemoglobin 13.4  12.0 - 15.0 (g/dL)   HCT 16.1  09.6 - 04.5 (%)   MCV 88.9  78.0 - 100.0 (fL)   MCH 30.9  26.0 - 34.0 (pg)   MCHC 34.7  30.0 - 36.0 (g/dL)   RDW 40.9  81.1 - 91.4 (%)   Platelets 436 (*) 150 - 400 (K/uL)  DIFFERENTIAL      Component Value Range   Neutrophils Relative 84 (*) 43 - 77 (%)   Neutro Abs 12.3 (*) 1.7 -  7.7 (K/uL)   Lymphocytes Relative 9 (*) 12 - 46 (%)   Lymphs Abs 1.3  0.7 - 4.0 (K/uL)   Monocytes Relative 7  3 - 12 (%)   Monocytes Absolute 1.0  0.1 - 1.0 (K/uL)   Eosinophils Relative 0  0 - 5 (%)   Eosinophils Absolute 0.0  0.0 - 0.7 (K/uL)   Basophils Relative 0  0 - 1 (%)   Basophils Absolute 0.0  0.0 - 0.1 (K/uL)  BASIC METABOLIC PANEL      Component Value Range   Sodium 127 (*) 135 - 145 (mEq/L)   Potassium 3.7  3.5 - 5.1 (mEq/L)   Chloride 90 (*) 96 - 112 (mEq/L)   CO2 28  19 - 32 (mEq/L)   Glucose, Bld 140 (*) 70 - 99 (mg/dL)   BUN 8  6 - 23 (mg/dL)   Creatinine, Ser 7.82 (*) 0.50 - 1.10 (mg/dL)   Calcium 8.7  8.4 - 95.6 (mg/dL)   GFR calc non Af Amer 83 (*) >90 (mL/min)   GFR calc Af Amer >90  >90 (mL/min)     Dg Chest 2 View  06/04/2011  *RADIOLOGY REPORT*  Clinical Data: Productive cough.  CHEST - 2 VIEW  Comparison: Chest radiograph performed 01/25/2010  Findings: The lungs are well-aerated.  Patchy bilateral airspace opacification is noted, more prominent on the left.  Small bilateral pleural effusions are suspected.  Airspace opacities have a predominantly peripheral distribution, and raise suspicion for pneumonia, though mild underlying vascular congestion is seen.  A calcific pleural plaque at the left upper lung zone is more prominent than on prior studies.  The heart is normal in size; the mediastinal contour is within normal limits.  No acute osseous abnormalities are seen.  IMPRESSION:  1.  New patchy bilateral airspace opacities, more prominent on the left, with likely small bilateral pleural effusions.  Airspace opacities have a predominantly peripheral distribution, raising suspicion for pneumonia.  Suggest repeat chest radiograph after completing treatment of pneumonia to exclude underlying process, as deemed clinically appropriate. 2.  Increased prominence of calcific pleural plaque at the left upper lung zone. 3.  Mild vascular congestion noted.  Original Report  Authenticated By: Tonia Ghent, M.D.     1. CAP (community acquired pneumonia)   2. Hyponatremia       MDM  1:45 AM patient seen and evaluated. Patient in no acute distress. Patient with normal respirations, occasional cough, with good O2 sats.  3:00 AM patient seen and discussed with attending physician. Patient with signs of pneumonia on chest x-ray. Will plan for admission.   Angus Seller, [place] 06/04/11 [phone]

## 2011-06-04 NOTE — ED Provider Notes (Signed)
77 year old, female, with no significant past medical history complains of a productive cough since Thanksgiving, which was 4 days ago.  She denies fevers, chills, or diaphoresis.  She states that she has chest pain with her cough, but otherwise does not have pain anywhere else.  She does not smoke.  She is allergic to no medications.  On examination.  She is mildly tachypneic with rales.  On the left hand side.  We will perform a chest x-ray, and laboratory testing, to look for pneumonia.  Nicholes Stairs, MD 06/04/11 [phone]

## 2011-06-04 NOTE — H&P (Signed)
Austen Wygant MRN: [id number] DOB/AGE: Jul 23, 1913 77 y.o. Primary Care Physician:No primary provider on file. Admit date: 06/04/2011 Chief Complaint: cough HPI:  Ms [name] is a pleasant 77 year old Caucasian female with history of hypertension, history of hypovolemic hyponatremia, history of orthostasis, history of osteoporosis who presents to the ED with a four-day history of a productive cough. Patient states that had a grandchildren over on Thanksgiving who had coughs and ever since then she has had a productive cough. Patient has had associated chest pain while coughing which is relieved after cough is over. Patient denies any fevers, no shortness of breath, no chills, no palpitations, no nausea, no vomiting, no diarrhea, no constipation, no weakness, no dysuria, no other associated symptoms. Patient was seen in the emergency room, chest x-ray which was done was consistent with a pneumonia. CBC obtained did show a leukocytosis with a white count of 14.7 , a be met with a sodium of 127 . We were called to admit the patient for further evaluation and management.  Past Medical History  Diagnosis Date  . Hypertension   . Compression fracture of thoracic vertebra   . Osteoporosis   . Carotid artery bruit   . Orthostasis   . Aortic stenosis, mild 06/2007    per echo of 06/17/2007  . COPD (chronic obstructive pulmonary disease) 12/2003    per CT chest 12/10/2003  . Interstitial lung disease     per CT of 12/10/2003  . Dizziness   . Fall   . Dehydration with hyponatremia 06/2007  . Elevation of cardiac enzymes 06/2007    Past Surgical History  Procedure Date  . Abdominal hysterectomy   . Eye surgery     cataract surgery    Prior to Admission medications   Not on File    Allergies:  Allergies  Allergen Reactions  . Penicillins  rash     History reviewed. No pertinent family history.  Social History:  reports that she has never smoked. She has never used smokeless tobacco. She reports  that she does not drink alcohol or use illicit drugs.  ROS: All systems reviewed with the patient and was positive as per HPI otherwise all other systems are negative.  PHYSICAL EXAM: Blood pressure 179/78, pulse 87, temperature 98.5 F (36.9 C), temperature source Oral, resp. rate 18, height 5\' 3"  (1.6 m), weight 45.36 kg (100 lb), SpO2 97.00%. General: Well-developed well-nourished female in no acute cardiopulmonary distress. HEENT: Normal cephalic, atraumatic, PERRLA, EOMI, oropharynx is clear moist no lesions no exudates. Neck is supple with no lymphadenopathy. RESP:  diffuse bilateral coarse breath sounds,  diffuse rhonchi Cardiovascular: Regular rate rhythm, 2/6 systolic ejection murmur Abdomen: Soft, nontender, nondistended, positive bowel sounds. Extremities: No clubbing cyanosis or edema.  No results found for this or any previous visit (from the past 240 hour(s)).   Lab results:  Basename 06/04/11 1355 06/04/11 0552  NA -- 127*  K -- 3.7  CL -- 90*  CO2 -- 28  GLUCOSE -- 140*  BUN -- 8  CREATININE 0.41* 0.44*  CALCIUM -- 8.7  MG 1.7 --  PHOS -- --   No results found for this basename: AST:2,ALT:2,ALKPHOS:2,BILITOT:2,PROT:2,ALBUMIN:2 in the last 72 hours No results found for this basename: LIPASE:2,AMYLASE:2 in the last 72 hours  Basename 06/04/11 1355 06/04/11 0333  WBC 14.5* 14.7*  NEUTROABS -- 12.3*  HGB 13.5 13.4  HCT 39.4 38.6  MCV 90.0 88.9  PLT 437* 436*   No results found for this basename: CKTOTAL:3,CKMB:3,CKMBINDEX:3,TROPONINI:3  in the last 72 hours No results found for this basename: POCBNP:3 in the last 72 hours No results found for this basename: DDIMER in the last 72 hours No results found for this basename: HGBA1C:2 in the last 72 hours No results found for this basename: CHOL:2,HDL:2,LDLCALC:2,TRIG:2,CHOLHDL:2,LDLDIRECT:2 in the last 72 hours No results found for this basename: TSH,T4TOTAL,FREET3,T3FREE,THYROIDAB in the last 72 hours No results  found for this basename: VITAMINB12:2,FOLATE:2,FERRITIN:2,TIBC:2,IRON:2,RETICCTPCT:2 in the last 72 hours Imaging results:  Dg Chest 2 View  06/04/2011  *RADIOLOGY REPORT*  Clinical Data: Productive cough.  CHEST - 2 VIEW  Comparison: Chest radiograph performed 01/25/2010  Findings: The lungs are well-aerated.  Patchy bilateral airspace opacification is noted, more prominent on the left.  Small bilateral pleural effusions are suspected.  Airspace opacities have a predominantly peripheral distribution, and raise suspicion for pneumonia, though mild underlying vascular congestion is seen.  A calcific pleural plaque at the left upper lung zone is more prominent than on prior studies.  The heart is normal in size; the mediastinal contour is within normal limits.  No acute osseous abnormalities are seen.  IMPRESSION:  1.  New patchy bilateral airspace opacities, more prominent on the left, with likely small bilateral pleural effusions.  Airspace opacities have a predominantly peripheral distribution, raising suspicion for pneumonia.  Suggest repeat chest radiograph after completing treatment of pneumonia to exclude underlying process, as deemed clinically appropriate. 2.  Increased prominence of calcific pleural plaque at the left upper lung zone. 3.  Mild vascular congestion noted.  Original Report Authenticated By: Tonia Ghent, M.D.   Impression/Plan:  Principal Problem:  *PNA (pneumonia) Active Problems:  Hyponatremia  Osteoporosis  Compression fx, thoracic spine  HTN (hypertension)   #1 community acquired pneumonia-patient presents with a cough and elevated white count chest x-ray consistent with a pneumonia. Will admit the patient, check a sputum Gram stain and culture, place on IV Levaquin, Mucinex, nebs as needed, oxygen, supportive care. #2 hyponatremia-patient has had a history of HYPOVOLEMIC hyponatremia, in the past. Will check orthostatics, check a urine and serum osmolality, check a urine  sodium, check a urine creatinine, check a TSH. Place on gentle hydration with IV fluids. #3 hypertension-we'll place patient on Norvasc 5 mg daily. #4 prophylaxis-Lovenox for DVT prophylaxis.   Annalicia Renfrew 06/04/2011, 10:19 PM

## 2011-06-05 LAB — BASIC METABOLIC PANEL
BUN: 9 mg/dL (ref 6–23)
CO2: 25 mEq/L (ref 19–32)
CO2: 25 mEq/L (ref 19–32)
Chloride: 93 mEq/L — ABNORMAL LOW (ref 96–112)
Chloride: 90 mEq/L — ABNORMAL LOW (ref 96–112)
Creatinine, Ser: 0.39 mg/dL — ABNORMAL LOW (ref 0.50–1.10)
Glucose, Bld: 118 mg/dL — ABNORMAL HIGH (ref 70–99)
Glucose, Bld: 105 mg/dL — ABNORMAL HIGH (ref 70–99)
Sodium: 126 mEq/L — ABNORMAL LOW (ref 135–145)

## 2011-06-05 LAB — CBC
HCT: 36.2 % (ref 36.0–46.0)
Hemoglobin: 12.2 g/dL (ref 12.0–15.0)
MCV: 89.2 fL (ref 78.0–100.0)
RBC: 4.06 MIL/uL (ref 3.87–5.11)
WBC: 11.4 10*3/uL — ABNORMAL HIGH (ref 4.0–10.5)

## 2011-06-05 MED ORDER — FUROSEMIDE 10 MG/ML IJ SOLN
20.0000 mg | Freq: Once | INTRAMUSCULAR | Status: AC
  Administered 2011-06-05: 20 mg via INTRAVENOUS
  Filled 2011-06-05: qty 2

## 2011-06-05 MED ORDER — LEVOFLOXACIN IN D5W 750 MG/150ML IV SOLN
750.0000 mg | INTRAVENOUS | Status: DC
  Administered 2011-06-07 – 2011-06-09 (×2): 750 mg via INTRAVENOUS
  Filled 2011-06-05 (×2): qty 150

## 2011-06-05 NOTE — Progress Notes (Signed)
CARE MANAGEMENT NOTE 06/05/2011  Patient:  Wales,Tamanna   Account Number:  [id number]  Date Initiated:  06/05/2011  Documentation initiated by:  Alyssia Heese  Subjective/Objective Assessment:   77 y.o. female with pnuemonia by cxr     Action/Plan:   lives at home with family   Anticipated DC Date:  06/08/2011   Anticipated DC Plan:  HOME/SELF CARE  In-house referral  NA      DC Planning Services  NA      [facility] Choice  NA   Choice offered to / List presented to:  NA   DME arranged  NA      DME agency  NA     HH arranged  NA      Status of service:  In process, will continue to follow Medicare Important Message given?  NA - LOS <3 / Initial given by admissions (If response is "NO", the following Medicare IM given date fields will be blank) Date Medicare IM given:  06/04/2011 Date Additional Medicare IM given:    Discharge Disposition:    Per UR Regulation:  Reviewed for med. necessity/level of care/duration of stay  Comments:  [id number]/Triston Lisanti Earlene Plater, RN, BSN, CCM/CHART REVIEW FOR UR PERFORMED.

## 2011-06-05 NOTE — Progress Notes (Signed)
Subjective: No complaints.  Objective: Vital signs in last 24 hours: Filed Vitals:   06/05/11 0506 06/05/11 0619 06/05/11 1234 06/05/11 1306  BP: 111/48   162/83  Pulse: 88   88  Temp: 97.7 F (36.5 C)   97.9 F (36.6 C)  TempSrc: Oral   Oral  Resp: 18   18  Height:      Weight: 50.304 kg (110 lb 14.4 oz)     SpO2: 91% 97% 95% 95%    Intake/Output Summary (Last 24 hours) at 06/05/11 1932 Last data filed at 06/05/11 1734  Gross per 24 hour  Intake   1252 ml  Output   1250 ml  Net      2 ml    Weight change:   General: Alert, awake, oriented x3, in no acute distress. HEENT: No bruits, no goiter. Heart: Regular rate and rhythm, without murmurs, rubs, gallops. Lungs: Coarse breath sounds diet diffusely. Abdomen: Soft, nontender, nondistended, positive bowel sounds. Extremities: No clubbing cyanosis or edema with positive pedal pulses. Neuro: Grossly intact, nonfocal.   Lab Results:  Basename 06/05/11 0425 06/04/11 1355 06/04/11 0552  NA 126* -- 127*  K 3.6 -- 3.7  CL 93* -- 90*  CO2 25 -- 28  GLUCOSE 118* -- 140*  BUN 8 -- 8  CREATININE 0.39* 0.41* --  CALCIUM 8.6 -- 8.7  MG -- 1.7 --  PHOS -- -- --   No results found for this basename: AST:2,ALT:2,ALKPHOS:2,BILITOT:2,PROT:2,ALBUMIN:2 in the last 72 hours No results found for this basename: LIPASE:2,AMYLASE:2 in the last 72 hours  Basename 06/05/11 0425 06/04/11 1355 06/04/11 0333  WBC 11.4* 14.5* --  NEUTROABS -- -- 12.3*  HGB 12.2 13.5 --  HCT 36.2 39.4 --  MCV 89.2 90.0 --  PLT 430* 437* --   No results found for this basename: CKTOTAL:3,CKMB:3,CKMBINDEX:3,TROPONINI:3 in the last 72 hours No results found for this basename: POCBNP:3 in the last 72 hours No results found for this basename: DDIMER:2 in the last 72 hours No results found for this basename: HGBA1C:2 in the last 72 hours No results found for this basename: CHOL:2,HDL:2,LDLCALC:2,TRIG:2,CHOLHDL:2,LDLDIRECT:2 in the last 72 hours  Basename  06/04/11 1355  TSH 1.029  T4TOTAL --  T3FREE --  THYROIDAB --   No results found for this basename: VITAMINB12:2,FOLATE:2,FERRITIN:2,TIBC:2,IRON:2,RETICCTPCT:2 in the last 72 hours  Micro Results: No results found for this or any previous visit (from the past 240 hour(s)).  Studies/Results: Dg Chest 2 View  06/04/2011  *RADIOLOGY REPORT*  Clinical Data: Productive cough.  CHEST - 2 VIEW  Comparison: Chest radiograph performed 01/25/2010  Findings: The lungs are well-aerated.  Patchy bilateral airspace opacification is noted, more prominent on the left.  Small bilateral pleural effusions are suspected.  Airspace opacities have a predominantly peripheral distribution, and raise suspicion for pneumonia, though mild underlying vascular congestion is seen.  A calcific pleural plaque at the left upper lung zone is more prominent than on prior studies.  The heart is normal in size; the mediastinal contour is within normal limits.  No acute osseous abnormalities are seen.  IMPRESSION:  1.  New patchy bilateral airspace opacities, more prominent on the left, with likely small bilateral pleural effusions.  Airspace opacities have a predominantly peripheral distribution, raising suspicion for pneumonia.  Suggest repeat chest radiograph after completing treatment of pneumonia to exclude underlying process, as deemed clinically appropriate. 2.  Increased prominence of calcific pleural plaque at the left upper lung zone. 3.  Mild vascular congestion noted.  Original Report Authenticated By: Tonia Ghent, M.D.    Medications:     . amLODipine  5 mg Oral Daily  . amLODipine  5 mg Oral Daily  . aspirin EC  81 mg Oral Daily  . dextromethorphan-guaiFENesin  1 tablet Oral BID  . docusate sodium  100 mg Oral BID  . enoxaparin  40 mg Subcutaneous Q24H  . furosemide  20 mg Intravenous Once  . levofloxacin (LEVAQUIN) IV  750 mg Intravenous Q48H  . DISCONTD: levofloxacin (LEVAQUIN) IV  750 mg Intravenous Q24H     Assessment/Plan Principal Problem:  *PNA (pneumonia) Active Problems:  Hyponatremia  Osteoporosis  Compression fx, thoracic spine  HTN (hypertension)   #1 community acquired pneumonia Patient is afebrile. WBC is trending down. Clinical improvement. Continue Mucinex, oxygen, IV Levaquin day #2/7-10. Nebs as needed. #2 hyponatremia Patient does have a history of chronic hyponatremia with baseline sodium in the low 130s per family. Urine osmolality is decreased. Urine sodium is 98. FGNA is equal to 0.83%. Sodium levels decreasing with IV fluids. Will saline lock IV fluids. We'll place on fluid restriction of 1200 cc per day. Lasix 20 mg IV x1. Check a be met stat. Follow in the a.m. #3 hypertension Continue low-dose Norvasc at 5 mg daily. #4 prophylaxis- Lovenox for DVT prophylaxis.   LOS: 1 day   G Werber [facility] 06/05/2011, 7:32 PM

## 2011-06-05 NOTE — Progress Notes (Signed)
Occupational Therapy Evaluation Patient Details Name: [name] MRN: [id number] DOB: 06/08/1914 Today's Date: 06/05/2011 Time: 9:44-10:10am Pt seen for OT eval in conjunction w/ PT Problem List:  Patient Active Problem List  Diagnoses  . PNA (pneumonia)  . Hyponatremia  . Osteoporosis  . Compression fx, thoracic spine  . HTN (hypertension)    Past Medical History:  Past Medical History  Diagnosis Date  . Hypertension   . Compression fracture of thoracic vertebra   . Osteoporosis   . Carotid artery bruit   . Orthostasis   . Aortic stenosis, mild 06/2007    per echo of 06/17/2007  . COPD (chronic obstructive pulmonary disease) 12/2003    per CT chest 12/10/2003  . Interstitial lung disease     per CT of 12/10/2003  . Dizziness   . Fall   . Dehydration with hyponatremia 06/2007  . Elevation of cardiac enzymes 06/2007   Past Surgical History:  Past Surgical History  Procedure Date  . Abdominal hysterectomy   . Eye surgery     cataract surgery    OT Assessment/Plan/Recommendation OT Assessment Clinical Impression Statement: Pt is currently Mod I - Min guard assist for ADL's and selfcare tasks using SPC. Pt will benefit from acute OT to assist with increasing independence in ADL's prior to anticipated d/c home. Rec HHOT for home safety eval and assessment.  OT Recommendation/Assessment: Patient will need skilled OT in the acute care venue OT Problem List: Decreased activity tolerance;Decreased knowledge of use of DME or AE OT Therapy Diagnosis : Generalized weakness OT Plan OT Frequency: Min 1X/week OT Treatment/Interventions: Self-care/ADL training;Therapeutic activities;DME and/or AE instruction;Patient/family education OT Recommendation Follow Up Recommendations: Home health OT Equipment Recommended: None recommended by PT Individuals Consulted Consulted and Agree with Results and Recommendations: Patient OT Goals Acute Rehab OT Goals OT Goal Formulation: With  patient ADL Goals Pt Will Perform Grooming: with modified independence;Standing at sink ADL Goal: Grooming - Progress: Progressing toward goals Pt Will Perform Upper Body Dressing: with modified independence;Sitting, chair;Sitting, bed;Unsupported ADL Goal: Upper Body Dressing - Progress: Progressing toward goals Pt Will Perform Lower Body Dressing: with modified independence;Sitting, bed;Sitting, chair;Unsupported ADL Goal: Lower Body Dressing - Progress: Progressing toward goals Pt Will Perform Tub/Shower Transfer: Tub transfer;with supervision;with DME ADL Goal: Tub/Shower Transfer - Progress: Progressing toward goals Additional ADL Goal #1: Pt will perform all aspects of toileting (hygeine in sitting; clothing negotiation & transfer) w/ Mod I using SPC, grab bar and 3:1 vs regular toilet ADL Goal: Additional Goal #1 - Progress: Progressing toward goals  OT Evaluation Precautions/Restrictions  Precautions Precautions: Other (comment);Fall (DNR) Restrictions Weight Bearing Restrictions: No Prior Functioning Home Living Lives With: Alone Receives Help From: Personal care attendant Type of Home: Other (Comment) (Townhouse) Home Layout: Two level;1/2 bath on main level;Other (Comment) (Able to live on main if sleeps on couch, per pt) Alternate Level Stairs-Rails: Right Alternate Level Stairs-Number of Steps: 14 steps to second floor Home Access: Level entry Entrance Stairs-Rails: None Entrance Stairs-Number of Steps: 0 Bathroom Shower/Tub: Tub/shower unit;Other (comment) (Sponge bathes, has assist if using shower) Bathroom Toilet: Standard (with grab bars) Home Adaptive Equipment: Straight cane;Walker - rolling;Other (comment) (Grab bars in bathroom per pt report) Prior Function Level of Independence: Independent with basic ADLs;Requires assistive device for independence;Needs assistance with homemaking;Other (comment) (Assistance w/ tub transfers, otherwise sponge bathes) Light  Housekeeping: Other (comment) (Assist 2x/week per pt) ADL ADL Eating/Feeding: Performed;Independent Where Assessed - Eating/Feeding: Chair Grooming: Performed;Wash/dry hands;Supervision/safety;Modified independent Where Assessed - Grooming:  Standing at sink Upper Body Bathing: Simulated;Modified independent Where Assessed - Upper Body Bathing: Sitting, chair;Supported;Unsupported Lower Body Bathing: Simulated;Supervision/safety;Set up Where Assessed - Lower Body Bathing: Sitting, chair Upper Body Dressing: Performed;Modified independent Where Assessed - Upper Body Dressing: Sit to stand from chair;Sitting, chair Lower Body Dressing: Simulated;Modified independent Where Assessed - Lower Body Dressing: Sitting, chair;Unsupported Toilet Transfer: Performed;Supervision/safety;Set up;Minimal assistance Toilet Transfer Details (indicate cue type and reason): Min guard assist stand-sit and VC's/TC's using [facility] Toilet Transfer Method: Ambulating Toilet Transfer Equipment: Comfort height toilet;Grab bars;Other (comment) (SPC) Toileting - Clothing Manipulation: Performed;Supervision/safety Toileting - Clothing Manipulation Details (indicate cue type and reason): VC/TC's Where Assessed - Toileting Clothing Manipulation: Sit to stand from 3-in-1 or toilet;Standing Toileting - Hygiene: Performed;Supervision/safety;Minimal assistance Toileting - Hygiene Details (indicate cue type and reason): Min guard assist as pt leans forward and is w/ unsteady gait, pt would most likely be Mod I if toileting hygeine were completed while sitting on commode Where Assessed - Toileting Hygiene: Standing;Sit to stand from 3-in-1 or toilet Equipment Used: Gilmer Mor;Other (comment) (gait belt, grab bar) ADL Comments: Pt is currently Mod I - Min guard assist for ADL's and selfcare tasks using SPC. Pt will benefit from acute OT to assist with increasing independence in ADL's prior to anticipated d/c home. Rec HHOT for home safety  eval and assessment.  Vision/Perception  Vision - History Patient Visual Report: No change from baseline Cognition Cognition Arousal/Alertness: Awake/alert Overall Cognitive Status: Appears within functional limits for tasks assessed Sensation/Coordination Sensation Light Touch: Appears Intact Coordination Gross Motor Movements are Fluid and Coordinated: Yes Fine Motor Movements are Fluid and Coordinated: Yes Extremity Assessment RUE Assessment RUE Assessment: Within Functional Limits LUE Assessment LUE Assessment: Within Functional Limits Mobility  Bed Mobility Bed Mobility: No Transfers Sit to Stand: 5: Supervision;From chair/3-in-1;4: Min assist;With upper extremity assist Sit to Stand Details (indicate cue type and reason): Min guard assist, pt using SPC & grab bar in bathroom Stand to Sit: 4: Min assist;5: Supervision;To chair/3-in-1;To bed Stand to Sit Details: With Min guard assist Exercises   End of Session OT - End of Session Equipment Utilized During Treatment: Gait belt;Other (comment) [facility]) Activity Tolerance: Patient tolerated treatment well Patient left: in chair;with call bell in reach General Behavior During Session: [facility] for tasks performed Cognition: [facility] for tasks performed   Alm Bustard 06/05/2011, 12:59 PM

## 2011-06-05 NOTE — Progress Notes (Signed)
Physical Therapy Evaluation Patient Details Name: [name] MRN: [id number] DOB: 13-Oct-1913 Today's Date: 06/05/2011 Time: 4098-1191  Eval II Problem List:  Patient Active Problem List  Diagnoses  . PNA (pneumonia)  . Hyponatremia  . Osteoporosis  . Compression fx, thoracic spine  . HTN (hypertension)    Past Medical History:  Past Medical History  Diagnosis Date  . Hypertension   . Compression fracture of thoracic vertebra   . Osteoporosis   . Carotid artery bruit   . Orthostasis   . Aortic stenosis, mild 06/2007    per echo of 06/17/2007  . COPD (chronic obstructive pulmonary disease) 12/2003    per CT chest 12/10/2003  . Interstitial lung disease     per CT of 12/10/2003  . Dizziness   . Fall   . Dehydration with hyponatremia 06/2007  . Elevation of cardiac enzymes 06/2007   Past Surgical History:  Past Surgical History  Procedure Date  . Abdominal hysterectomy   . Eye surgery     cataract surgery    PT Assessment/Plan/Recommendation PT Assessment Clinical Impression Statement: Pt presents with diagnosis of Pna. Pt will benefit from skilled PT in the acute care setting to maximize independence with basic functional mobility in preperation for D/C home PT Recommendation/Assessment: Patient will need skilled PT in the acute care venue PT Problem List: Decreased mobility PT Therapy Diagnosis : Difficulty walking PT Plan PT Frequency: Min 3X/week PT Treatment/Interventions: Gait training;Functional mobility training;Patient/family education PT Recommendation Recommendations for Other Services: Other (comment) (OT already on board) Follow Up Recommendations:  Home safety evaluation Equipment Recommended: None recommended by PT PT Goals  Acute Rehab PT Goals PT Goal Formulation: With patient Pt will Ambulate: 51 - 150 feet;with modified independence;with cane Pt will Go Up / Down Stairs: 6-9 stairs;with modified independence;with rail(s)  PT  Evaluation Precautions/Restrictions    Prior Functioning  Home Living Lives With: Alone Receives Help From: Personal care attendant Type of Home: Other (Comment) (townhome) Home Layout: Two level Alternate Level Stairs-Rails: Right Alternate Level Stairs-Number of Steps: 14. Pt states she sometimes sleeps on couch on 1st level.  Home Access: Level entry Home Adaptive Equipment: Straight cane Prior Function Level of Independence: Needs assistance with homemaking;Requires assistive device for independence Cognition Cognition Arousal/Alertness: Awake/alert Overall Cognitive Status: Appears within functional limits for tasks assessed Sensation/Coordination Coordination Gross Motor Movements are Fluid and Coordinated: Yes Extremity Assessment  RLE Assessment RLE Assessment: Within Functional Limits LLE Assessment LLE Assessment: Within Functional Limits Mobility (including Balance) Bed Mobility Bed Mobility: No Transfers Transfers: Yes Sit to Stand: 6: Modified independent (Device/Increase time) Stand to Sit: 6: Modified independent (Device/Increase time) Ambulation/Gait Ambulation/Gait: Yes Ambulation/Gait Assistance: Min-guard assist Ambulation Distance (Feet): 90 Feet. O2 sats 94% on RA after ambulation. Assistive device: Straight cane Gait Pattern: Decreased step length - right;Decreased step length - left;Right foot flat;Left foot flat  Posture/Postural Control Posture/Postural Control: No significant limitations Exercise    End of Session PT - End of Session Equipment Utilized During Treatment: Gait belt Activity Tolerance: Patient tolerated treatment well Patient left: in chair;with call bell in reach;Other (comment) (with chair alarm) General Behavior During Session: [facility] for tasks performed Cognition: [facility] for tasks performed  Rebeca Alert [facility] 06/05/2011, 10:34 AM

## 2011-06-06 LAB — CBC
HCT: 37.7 % (ref 36.0–46.0)
MCH: 30.7 pg (ref 26.0–34.0)
MCHC: 34.7 g/dL (ref 30.0–36.0)
MCV: 88.3 fL (ref 78.0–100.0)
RDW: 13.7 % (ref 11.5–15.5)
WBC: 9.7 10*3/uL (ref 4.0–10.5)

## 2011-06-06 LAB — BASIC METABOLIC PANEL
BUN: 10 mg/dL (ref 6–23)
Chloride: 89 mEq/L — ABNORMAL LOW (ref 96–112)
Creatinine, Ser: 0.45 mg/dL — ABNORMAL LOW (ref 0.50–1.10)
GFR calc Af Amer: >90 mL/min (ref >90)

## 2011-06-06 LAB — URINE CULTURE

## 2011-06-06 MED ORDER — DM-GUAIFENESIN ER 30-600 MG PO TB12
2.0000 | ORAL_TABLET | Freq: Two times a day (BID) | ORAL | Status: DC
  Administered 2011-06-06 – 2011-06-09 (×7): 2 via ORAL
  Filled 2011-06-06 (×10): qty 2

## 2011-06-06 MED ORDER — FUROSEMIDE 10 MG/ML IJ SOLN
20.0000 mg | Freq: Once | INTRAMUSCULAR | Status: AC
  Administered 2011-06-06: 20 mg via INTRAVENOUS
  Filled 2011-06-06: qty 2

## 2011-06-06 NOTE — Progress Notes (Signed)
Physical Therapy Treatment Patient Details Name: [name] MRN: [id number] DOB: 1913-10-25 Today's Date: 06/06/2011  PT Assessment/Plan  PT - Assessment/Plan Comments on Treatment Session: Pt very pleasant & eager to participate in PT session.  Progressing with PT goals today.   PT Plan: Discharge plan remains appropriate PT Frequency: Min 3X/week Follow Up Recommendations: Other (comment) (Home Safety Evaluation (Set by PT 06/05/11)) Equipment Recommended: None recommended by PT PT Goals  Acute Rehab PT Goals PT Goal: Ambulate - Progress: Progressing toward goal PT Goal: Up/Down Stairs - Progress: Progressing toward goal  PT Treatment Precautions/Restrictions  Precautions Precautions: Other (comment) (DNR) Restrictions Weight Bearing Restrictions: No Mobility (including Balance) Bed Mobility Bed Mobility: No Transfers Sit to Stand: 6: Modified independent (Device/Increase time);From chair/3-in-1;With armrests;With upper extremity assist Stand to Sit: 6: Modified independent (Device/Increase time);To chair/3-in-1;With armrests;With upper extremity assist Ambulation/Gait Ambulation/Gait Assistance: 5: Supervision Ambulation/Gait Assistance Details (indicate cue type and reason): Supervision for safety; no LOB noted; Pt alternates use of SPC wtih hands as ambulating.  Pt with decreased step/stride length initially but improved with distance.   Ambulation Distance (Feet): 140 Feet Assistive device: Straight cane Gait Pattern: Step-through pattern;Decreased stride length;Decreased step length - right;Decreased step length - left Stairs: Yes Stairs Assistance: Other (comment) (Min Guard (A); cues for step to pattern to increase safety) Stair Management Technique: One rail Right;With cane;Step to pattern Number of Stairs: 6     Exercise  Long Arc Quad: Strengthening;Both;10 reps;Seated (manual resistance provided) Marching in Standing: Strengthening;Seated;Both;10 reps;Other  (comment) (manual resistance provided) Hamstring Curls; Strengthening; Manual resistance provided; 10 reps; seated; both  End of Session PT - End of Session Equipment Utilized During Treatment: Gait belt Activity Tolerance: Patient tolerated treatment well Patient left: in chair;with call bell in reach;Other (comment) (chair alarm set) General Behavior During Session: [facility] for tasks performed Cognition: [facility] for tasks performed  Lara Mulch 06/06/2011, 12:04 PM

## 2011-06-06 NOTE — Progress Notes (Signed)
Pt had 3 beats of V. Tach on heart monitor. VSS, pt is asymptomatic.  MD on call notified- Dr. Orvan Falconer.  No new orders at this time.  Will continue to monitor. Newman Nip Tetherow

## 2011-06-06 NOTE — Progress Notes (Signed)
Subjective: Still with cough -nonproductive, alert and appropriate.  Objective: Vital signs in last 24 hours: Filed Vitals:   06/05/11 1234 06/05/11 1306 06/05/11 2109 06/06/11 0451  BP:  162/83 125/70 134/76  Pulse:  88 75 91  Temp:  97.9 F (36.6 C) 98.6 F (37 C) 98.1 F (36.7 C)  TempSrc:  Oral Oral Oral  Resp:  18 18 18   Height:      Weight:    49.079 kg (108 lb 3.2 oz)  SpO2: 95% 95% 95% 95%    Intake/Output Summary (Last 24 hours) at 06/06/11 1010 Last data filed at 06/06/11 0834  Gross per 24 hour  Intake    657 ml  Output   1525 ml  Net   -868 ml    Weight change: 3.719 kg (8 lb 3.2 oz)  General: Alert, awake, oriented x3, in no acute distress. HEENT: No bruits, no goiter. Heart: Regular rate and rhythm, without murmurs, rubs, gallops. Lungs: Coarse breath sounds bil. Abdomen: Soft, nontender, nondistended, positive bowel sounds. Extremities: No clubbing cyanosis or edema with positive pedal pulses. Neuro: Grossly intact, nonfocal.   Lab Results:  Basename 06/06/11 0419 06/05/11 1910 06/04/11 1355  NA 125* 124* --  K 3.8 3.7 --  CL 89* 90* --  CO2 25 25 --  GLUCOSE 126* 105* --  BUN 10 9 --  CREATININE 0.45* 0.39* --  CALCIUM 9.0 8.7 --  MG -- -- 1.7  PHOS -- -- --   No results found for this basename: AST:2,ALT:2,ALKPHOS:2,BILITOT:2,PROT:2,ALBUMIN:2 in the last 72 hours No results found for this basename: LIPASE:2,AMYLASE:2 in the last 72 hours  Basename 06/06/11 0419 06/05/11 0425 06/04/11 0333  WBC 9.7 11.4* --  NEUTROABS -- -- 12.3*  HGB 13.1 12.2 --  HCT 37.7 36.2 --  MCV 88.3 89.2 --  PLT 524* 430* --   No results found for this basename: CKTOTAL:3,CKMB:3,CKMBINDEX:3,TROPONINI:3 in the last 72 hours No results found for this basename: POCBNP:3 in the last 72 hours No results found for this basename: DDIMER:2 in the last 72 hours No results found for this basename: HGBA1C:2 in the last 72 hours No results found for this basename:  CHOL:2,HDL:2,LDLCALC:2,TRIG:2,CHOLHDL:2,LDLDIRECT:2 in the last 72 hours  Basename 06/04/11 1355  TSH 1.029  T4TOTAL --  T3FREE --  THYROIDAB --   No results found for this basename: VITAMINB12:2,FOLATE:2,FERRITIN:2,TIBC:2,IRON:2,RETICCTPCT:2 in the last 72 hours  Micro Results: Recent Results (from the past 240 hour(s))  URINE CULTURE     Status: Normal (Preliminary result)   Collection Time   06/04/11 10:09 AM      Component Value Range Status Comment   Specimen Description URINE, CLEAN CATCH   Final    Special Requests NONE   Final    Setup Time [id number]   Final    Colony Count >=100,000 COLONIES/ML   Final    Culture GRAM NEGATIVE RODS   Final    Report Status PENDING   Incomplete     Studies/Results: No results found.  Medications:     . amLODipine  5 mg Oral Daily  . aspirin EC  81 mg Oral Daily  . dextromethorphan-guaiFENesin  1 tablet Oral BID  . docusate sodium  100 mg Oral BID  . enoxaparin  40 mg Subcutaneous Q24H  . furosemide  20 mg Intravenous Once  . levofloxacin (LEVAQUIN) IV  750 mg Intravenous Q48H  . DISCONTD: levofloxacin (LEVAQUIN) IV  750 mg Intravenous Q24H    Assessment/Plan   #1 community  acquired pneumonia Patient is afebrile and leukocytosis resolved. Will increase Mucinex, continue oxygen, IV Levaquin day #2/7-10. Nebs as needed. Improving clinically #2 hyponatremia- acute on chonic -Sodium trending up, will continue fluid restriction and give another dose of Lasix - follow Patient does have a history of chronic hyponatremia with baseline sodium in the low 130s per family. Urine osmolality is decreased. Urine sodium is 98. FGNA is equal to 0.83%. Sodium levels decreasing with IV fluids. Will saline lock IV fluids. We'll place on fluid restriction of 1200 cc per day.  #3 hypertension Continue low-dose Norvasc at 5 mg daily. #4 prophylaxis- Lovenox for DVT prophylaxis. Called son in [place] for update but not available-left message.   LOS: 2 days   [name] C 06/06/2011, 10:10 AM

## 2011-06-07 LAB — BASIC METABOLIC PANEL
BUN: 15 mg/dL (ref 6–23)
Creatinine, Ser: 0.51 mg/dL (ref 0.50–1.10)
GFR calc Af Amer: >90 mL/min (ref >90)
GFR calc non Af Amer: 79 mL/min — ABNORMAL LOW (ref >90)
Potassium: 3.5 mEq/L (ref 3.5–5.1)

## 2011-06-07 LAB — CBC
HCT: 35.5 % — ABNORMAL LOW (ref 36.0–46.0)
MCHC: 33.8 g/dL (ref 30.0–36.0)
RDW: 13.6 % (ref 11.5–15.5)

## 2011-06-07 MED ORDER — FUROSEMIDE 10 MG/ML IJ SOLN
20.0000 mg | Freq: Once | INTRAMUSCULAR | Status: AC
  Administered 2011-06-07: 20 mg via INTRAVENOUS
  Filled 2011-06-07: qty 2

## 2011-06-07 NOTE — Progress Notes (Signed)
Subjective: states cough better, denies chest pain and alert and appropriate  Objective: Vital signs in last 24 hours: Filed Vitals:   06/06/11 0451 06/06/11 1450 06/06/11 2213 06/07/11 0655  BP: 134/76 115/68 125/61 148/73  Pulse: 91 88 82 85  Temp: 98.1 F (36.7 Glass) 97.6 F (36.4 Glass) 98.9 F (37.2 Glass) 98 F (36.7 Glass)  TempSrc: Oral Oral Oral Oral  Resp: 18 18 18 18   Height:      Weight: 49.079 kg (108 lb 3.2 oz)   49.3 kg (108 lb 11 oz)  SpO2: 95% 96% 96% 95%    Intake/Output Summary (Last 24 hours) at 06/07/11 0948 Last data filed at 06/07/11 0000  Gross per 24 hour  Intake    250 ml  Output    851 ml  Net   -601 ml    Weight change: 0.221 kg (7.8 oz)  General: Alert, awake, oriented x3, in no acute distress. HEENT: No bruits, no goiter. Heart: Regular rate and rhythm, without murmurs, rubs, gallops. Lungs: Breath sounds at the bases. Abdomen: Soft, nontender, nondistended, positive bowel sounds. Extremities: No clubbing cyanosis or edema with positive pedal pulses. Neuro: Grossly intact, nonfocal.   Lab Results:  Basename 06/07/11 0438 06/06/11 0419 06/04/11 1355  NA 128* 125* --  K 3.5 3.8 --  CL 91* 89* --  CO2 30 25 --  GLUCOSE 113* 126* --  BUN 15 10 --  CREATININE 0.51 0.45* --  CALCIUM 8.5 9.0 --  MG -- -- 1.7  PHOS -- -- --   No results found for this basename: AST:2,ALT:2,ALKPHOS:2,BILITOT:2,PROT:2,ALBUMIN:2 in the last 72 hours No results found for this basename: LIPASE:2,AMYLASE:2 in the last 72 hours  Basename 06/07/11 0438 06/06/11 0419  WBC 8.0 9.7  NEUTROABS -- --  HGB 12.0 13.1  HCT 35.5* 37.7  MCV 89.0 88.3  PLT 487* 524*   No results found for this basename: CKTOTAL:3,CKMB:3,CKMBINDEX:3,TROPONINI:3 in the last 72 hours No results found for this basename: POCBNP:3 in the last 72 hours No results found for this basename: DDIMER:2 in the last 72 hours No results found for this basename: HGBA1C:2 in the last 72 hours No results found  for this basename: CHOL:2,HDL:2,LDLCALC:2,TRIG:2,CHOLHDL:2,LDLDIRECT:2 in the last 72 hours  Basename 06/04/11 1355  TSH 1.029  T4TOTAL --  T3FREE --  THYROIDAB --   No results found for this basename: VITAMINB12:2,FOLATE:2,FERRITIN:2,TIBC:2,IRON:2,RETICCTPCT:2 in the last 72 hours  Micro Results: Recent Results (from the past 240 hour(s))  URINE CULTURE     Status: Normal   Collection Time   06/04/11 10:09 AM      Component Value Range Status Comment   Specimen Description URINE, CLEAN CATCH   Final    Special Requests NONE   Final    Setup Time [phone]   Final    Colony Count >=100,000 COLONIES/ML   Final    Culture ESCHERICHIA COLI   Final    Report Status 06/06/2011 FINAL   Final    Organism ID, Bacteria ESCHERICHIA COLI   Final     Studies/Results: No results found.  Medications:     . amLODipine  5 mg Oral Daily  . aspirin EC  81 mg Oral Daily  . dextromethorphan-guaiFENesin  2 tablet Oral BID  . docusate sodium  100 mg Oral BID  . enoxaparin  40 mg Subcutaneous Q24H  . furosemide  20 mg Intravenous Once  . furosemide  20 mg Intravenous Once  . levofloxacin (LEVAQUIN) IV  750 mg Intravenous  Q48H  . DISCONTD: dextromethorphan-guaiFENesin  1 tablet Oral BID    Assessment/Plan   #1 community acquired pneumonia Improving, continue current management. #2 hyponatremia- acute on chonic -Slowly improving, will continue fluid restriction and give another dose of Lasix - follow Patient does have a history of chronic hyponatremia with baseline sodium in the low 130s per family. Urine osmolality is decreased. Urine sodium is 98. FGNA is equal to 0.83%. Sodium levels decreasing with IV fluids. Will saline lock IV fluids. We'll place on fluid restriction of 1200 cc per day.  #3 hypertension Continue norvasc. #4 prophylaxis- Lovenox for DVT prophylaxis.   LOS: 3 days   [name] 06/07/2011, 9:48 AM

## 2011-06-07 NOTE — Progress Notes (Signed)
Occupational Therapy Evaluation Patient Details Name: [name] MRN: [id number] DOB: Mar 14, 1914 Today's Date: 06/07/2011  OT Assessment/Plan OT Assessment/Plan Comments on Treatment Session: Pt progressing well and should continue with skilled OT in acute care setting for ADL mobility safety until discharge home OT Plan: Discharge plan remains appropriate Follow Up Recommendations: Home health OT Equipment Recommended: None recommended by OT OT Goals ADL Goals ADL Goal: Grooming - Progress: Progressing toward goals ADL Goal: Tub/Shower Transfer - Progress: Progressing toward goals Additional ADL Goal #1: supervision with clothing mgt standing using grab bars, independent with hygiene sitting on toilet ADL Goal: Additional Goal #1 - Progress: Progressing toward goals  OT Treatment Precautions/Restrictions  Precautions Precautions: Fall Restrictions Weight Bearing Restrictions: No   ADL ADL Eating/Feeding: Not assessed Grooming: Performed;Wash/dry hands;Wash/dry face;Brushing hair;Supervision/safety Where Assessed - Grooming: Standing at sink Upper Body Bathing: Not assessed Lower Body Bathing: Not assessed Upper Body Dressing: Not assessed Lower Body Dressing: Not assessed Toilet Transfer: Performed;Supervision/safety Toilet Transfer Method: Ambulating Toilet Transfer Equipment: Grab bars;Regular height toilet Toileting - Clothing Manipulation: Performed;Supervision/safety Where Assessed - Toileting Hygiene: Sit on 3-in-1 or toilet Tub/Shower Transfer: Performed;Supervision/safety Tub/Shower Transfer Method: Science writer: Grab bars;Walk in shower Equipment Used: Cane Mobility  Bed Mobility Bed Mobility: No Transfers Transfers: Yes Sit to Stand: 6: Modified independent (Device/Increase time) Stand to Sit: 6: Modified independent (Device/Increase time)     End of Session OT - End of Session Equipment Utilized During Treatment: Gait  belt;Other (comment) [facility]) Activity Tolerance: Patient tolerated treatment well Patient left: in chair;with call bell in reach General Behavior During Session: [facility] for tasks performed Cognition: [facility] for tasks performed  Galen Manila MS, OTR/L 06/07/2011, 11:02 AM

## 2011-06-07 NOTE — Plan of Care (Signed)
Problem: Phase II Progression Outcomes Goal: Progress activity as tolerated unless otherwise ordered Pt progressing with OT goals for safety with ADL mobility. Agree with recommendation for [facility] OT for safety eval

## 2011-06-08 LAB — BASIC METABOLIC PANEL
Calcium: 8.8 mg/dL (ref 8.4–10.5)
Chloride: 91 mEq/L — ABNORMAL LOW (ref 96–112)
Creatinine, Ser: 0.49 mg/dL — ABNORMAL LOW (ref 0.50–1.10)
GFR calc Af Amer: >90 mL/min (ref >90)
GFR calc non Af Amer: 80 mL/min — ABNORMAL LOW (ref >90)

## 2011-06-08 MED ORDER — POTASSIUM CHLORIDE CRYS ER 20 MEQ PO TBCR
40.0000 meq | EXTENDED_RELEASE_TABLET | ORAL | Status: AC
  Administered 2011-06-08 (×2): 40 meq via ORAL
  Filled 2011-06-08 (×2): qty 2

## 2011-06-08 MED ORDER — FUROSEMIDE 10 MG/ML IJ SOLN
40.0000 mg | Freq: Once | INTRAMUSCULAR | Status: AC
  Administered 2011-06-08: 40 mg via INTRAVENOUS
  Filled 2011-06-08: qty 4

## 2011-06-08 NOTE — Progress Notes (Addendum)
Subjective: Alert and appropriate, feeling better today, denies any new complaints.  Objective: Vital signs in last 24 hours: Filed Vitals:   06/07/11 0655 06/07/11 1427 06/07/11 2135 06/08/11 0543  BP: 148/73 109/64 110/74 129/72  Pulse: 85 77 81 79  Temp: 98 F (36.7 Glass) 97.4 F (36.3 Glass) 97.7 F (36.5 Glass) 97.3 F (36.3 Glass)  TempSrc: Oral Oral Oral Oral  Resp: 18 18 18 18   Height:      Weight: 49.3 kg (108 lb 11 oz)   48.9 kg (107 lb 12.9 oz)  SpO2: 95% 94% 97% 96%    Intake/Output Summary (Last 24 hours) at 06/08/11 0905 Last data filed at 06/07/11 1846  Gross per 24 hour  Intake    480 ml  Output    400 ml  Net     80 ml    Weight change: -0.4 kg (-14.1 oz)  General: Alert, awake, oriented x3, in no acute distress. HEENT: No bruits, no goiter. Heart: Regular rate and rhythm, without murmurs, rubs, gallops. Lungs: Breath sounds at the bases. Abdomen: Soft, nontender, nondistended, positive bowel sounds. Extremities: No clubbing cyanosis or edema with positive pedal pulses. Neuro: Grossly intact, nonfocal.   Lab Results:  Basename 06/08/11 0403 06/07/11 0438  NA 127* 128*  K 3.4* 3.5  CL 91* 91*  CO2 29 30  GLUCOSE 114* 113*  BUN 19 15  CREATININE 0.49* 0.51  CALCIUM 8.8 8.5  MG -- --  PHOS -- --   No results found for this basename: AST:2,ALT:2,ALKPHOS:2,BILITOT:2,PROT:2,ALBUMIN:2 in the last 72 hours No results found for this basename: LIPASE:2,AMYLASE:2 in the last 72 hours  Basename 06/07/11 0438 06/06/11 0419  WBC 8.0 9.7  NEUTROABS -- --  HGB 12.0 13.1  HCT 35.5* 37.7  MCV 89.0 88.3  PLT 487* 524*   No results found for this basename: CKTOTAL:3,CKMB:3,CKMBINDEX:3,TROPONINI:3 in the last 72 hours No results found for this basename: POCBNP:3 in the last 72 hours No results found for this basename: DDIMER:2 in the last 72 hours No results found for this basename: HGBA1C:2 in the last 72 hours No results found for this basename:  CHOL:2,HDL:2,LDLCALC:2,TRIG:2,CHOLHDL:2,LDLDIRECT:2 in the last 72 hours No results found for this basename: TSH,T4TOTAL,FREET3,T3FREE,THYROIDAB in the last 72 hours No results found for this basename: VITAMINB12:2,FOLATE:2,FERRITIN:2,TIBC:2,IRON:2,RETICCTPCT:2 in the last 72 hours  Micro Results: Recent Results (from the past 240 hour(s))  URINE CULTURE     Status: Normal   Collection Time   06/04/11 10:09 AM      Component Value Range Status Comment   Specimen Description URINE, CLEAN CATCH   Final    Special Requests NONE   Final    Setup Time [phone]   Final    Colony Count >=100,000 COLONIES/ML   Final    Culture ESCHERICHIA COLI   Final    Report Status 06/06/2011 FINAL   Final    Organism ID, Bacteria ESCHERICHIA COLI   Final     Studies/Results: No results found.  Medications:     . amLODipine  5 mg Oral Daily  . aspirin EC  81 mg Oral Daily  . dextromethorphan-guaiFENesin  2 tablet Oral BID  . docusate sodium  100 mg Oral BID  . enoxaparin  40 mg Subcutaneous Q24H  . furosemide  20 mg Intravenous Once  . furosemide  40 mg Intravenous Once  . levofloxacin (LEVAQUIN) IV  750 mg Intravenous Q48H  . potassium chloride  40 mEq Oral Q4H    Assessment/Plan   #  1 community acquired pneumonia Clinically improved, continue current management. #2 hyponatremia- acute on chonic -Na about the same today, will continue fluid restriction and lasix again- follow recheck Patient does have a history of chronic hyponatremia with baseline sodium in the low 130s per family.  place on fluid restriction of 1200 cc per day.  #3 hypertension Continue norvasc. #4 hypokalemia-replace potassium. #5 E. coli urinary tract infection-pansensitive, continue Levaquin #6 prophylaxis- Lovenox for DVT prophylaxis.   LOS: 3 days   [name] 06/08/2011, 9:05 AM

## 2011-06-09 LAB — BASIC METABOLIC PANEL
Calcium: 9.2 mg/dL (ref 8.4–10.5)
GFR calc non Af Amer: 80 mL/min — ABNORMAL LOW (ref >90)
Sodium: 130 mEq/L — ABNORMAL LOW (ref 135–145)

## 2011-06-09 MED ORDER — AMLODIPINE BESYLATE 5 MG PO TABS: 5.0000 mg | ORAL_TABLET | Freq: Every day | ORAL | Status: AC

## 2011-06-09 MED ORDER — LEVOFLOXACIN 750 MG PO TABS: 750.0000 mg | ORAL_TABLET | ORAL | Status: AC

## 2011-06-09 MED ORDER — DM-GUAIFENESIN ER 30-600 MG PO TB12: 2.0000 | ORAL_TABLET | Freq: Two times a day (BID) | ORAL | Status: AC

## 2011-06-09 MED ORDER — ASPIRIN 81 MG PO TBEC: 81.0000 mg | DELAYED_RELEASE_TABLET | Freq: Every day | ORAL | Status: AC

## 2011-06-09 NOTE — Discharge Summary (Signed)
Discharge Note Name: [name] MRN: [id number] DOB: [birth year] 77 y.o.  Date of Admission: 06/04/2011  1:14 AM Date of Discharge: 06/09/2011 Attending Physician: Kela Millin  Discharge Diagnosis: Principal Problem:  *PNA (pneumonia) Active Problems:  Hyponatremia  Osteoporosis  Compression fx, thoracic spine  HTN (hypertension) E. coli urinary tract infection  Discharge Medications: Current Discharge Medication List    START taking these medications   Details  amLODipine (NORVASC) 5 MG tablet Take 1 tablet (5 mg total) by mouth daily. Qty: 30 tablet, Refills: 0    aspirin EC 81 MG EC tablet Take 1 tablet (81 mg total) by mouth daily. Qty: 30 tablet, Refills: 0    dextromethorphan-guaiFENesin (MUCINEX DM) 30-600 MG per 12 hr tablet Take 2 tablets by mouth 2 (two) times daily. Qty: 30 tablet, Refills: 0    levofloxacin (LEVAQUIN) 750 MG tablet Take 1 tablet (750 mg total) by mouth every other day. Qty: 3 tablet, Refills: 0        Disposition and follow-up:   Ms.[name] was discharged from [facility] in improved/stable condition.    Follow-up Appointments: Discharge Orders    Future Orders Please Complete By Expires   Diet general      Increase activity slowly      Discharge instructions      Comments:   1200 cc fluid restriction   Home Health      Scheduling Instructions:   Home health PT for safety Eval.   Questions: Responses:   To provide the following care/treatments PT    OT   Face-to-face encounter      Comments:   I Hurschel Paynter C certify that this patient is under my care and that I, or a nurse practitioner or physician's assistant working with me, had a face-to-face encounter that meets the physician face-to-face encounter requirements with this patient on 06/09/2011.       Questions: Responses:   The encounter with the patient was in whole, or in part, for the following medical condition, which is the primary reason for  home health care Pneumonia, hyponatremia.   I certify that, based on my findings, the following services are medically necessary home health services Physical therapy    Physical therapy   My clinical findings support the need for the above services High Risk for rehospitalization   Further, I certify that my clinical findings support that this patient is homebound (i.e. absences from home require considerable and taxing effort and are for medical reasons or religious services or infrequently or of short duration when for other reasons) Unable to leave home safely without assistance   To provide the following care/treatments PT    OT      Consultations: none  Procedures Performed:  Dg Chest 2 View  06/04/2011  *RADIOLOGY REPORT*  Clinical Data: Productive cough.  CHEST - 2 VIEW  Comparison: Chest radiograph performed 01/25/2010  Findings: The lungs are well-aerated.  Patchy bilateral airspace opacification is noted, more prominent on the left.  Small bilateral pleural effusions are suspected.  Airspace opacities have a predominantly peripheral distribution, and raise suspicion for pneumonia, though mild underlying vascular congestion is seen.  A calcific pleural plaque at the left upper lung zone is more prominent than on prior studies.  The heart is normal in size; the mediastinal contour is within normal limits.  No acute osseous abnormalities are seen.  IMPRESSION:  1.  New patchy bilateral airspace opacities, more prominent on the left, with likely  small bilateral pleural effusions.  Airspace opacities have a predominantly peripheral distribution, raising suspicion for pneumonia.  Suggest repeat chest radiograph after completing treatment of pneumonia to exclude underlying process, as deemed clinically appropriate. 2.  Increased prominence of calcific pleural plaque at the left upper lung zone. 3.  Mild vascular congestion noted.  Original Report Authenticated By: Tonia Ghent, M.D.   Brief  history The patient is due for a pleasant 77 year old white female with a history of hypertension hypovolemic hyponatremia and, orthostasis and osteoporosis who presented with a four-day history of a productive cough. She reported that she had had her grandchildren over for Thanksgiving and they had URI symptoms, including cough and since then she developed a productive cough. She admitted to chest pain while coughing. She was seen in the ED and a chest x-ray was done and was consistent with a pneumonia. Her white cell count was elevated at 14.7 and her sodium 127. She was admitted for further evaluation and management .  Physical exam General: Alert, awake, oriented x3, in no acute distress.  HEENT: No bruits, no goiter.  Heart: Regular rate and rhythm, without murmurs, rubs, gallops.  Lungs: decreased breath sounds at the bases, no crackles or wheezes.  Abdomen: Soft, nontender, nondistended, positive bowel sounds.  Extremities: No clubbing cyanosis or edema with positive pedal pulses.  Neuro: Grossly intact, nonfocal.    Hospital Course by problem list:  #1 community acquired pneumonia  Upon admission the patient was started on empiric antibiotics with Levaquin. She was also placed on mucolytics. She gradually improved. She has been afebrile, and hemodynamically stable. Her leukocytosis resolved -her last white cell count was 8.0. She'll be discharged home on oral antibiotics to complete a treatment course and she is to followup with her primary care physician outpatient.  #2 hyponatremia acute on chronic. 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 She was fluid restricted and treated with Lasix as needed and with that her sodium has improved to 130 today prior to discharge her. She's been  alert and mentating well. She is to continue the fluid restriction as directed upon discharge and to followup with her primary care physician the #3 hypertension  Her blood pressures were controlled on Norvasc during this hospital stay. #4 hypokalemia-her potassium was repleted during this hospitalization.  #5 E. coli urinary tract infection-her urine cultures grew Escherichia coli iand it was sensitive to quinolones with which she was being treated while in the hospital.     Discharge Vitals:  BP 112/70  Pulse 89  Temp(Src) 97.7 F (36.5 C) (Oral)  Resp 18  Ht 5\' 3"  (1.6 m)  Wt 48.943 kg (107 lb 14.4 oz)  BMI 19.11 kg/m2  SpO2 95%  Discharge Labs:  Results for orders placed during the hospital encounter of 06/04/11 (from the past 24 hour(s))  BASIC METABOLIC PANEL     Status: Abnormal   Collection Time   06/09/11  5:23 AM      Component Value Range   Sodium 130 (*) 135 - 145 (mEq/L)   Potassium 4.4  3.5 - 5.1 (mEq/L)   Chloride 95 (*) 96 - 112 (mEq/L)   CO2 31  19 - 32 (mEq/L)   Glucose, Bld 112 (*) 70 - 99 (mg/dL)   BUN 19  6 -  23 (mg/dL)   Creatinine, Ser 1.61  0.50 - 1.10 (mg/dL)   Calcium 9.2  8.4 - 09.6 (mg/dL)   GFR calc non Af Amer 80 (*) >90 (mL/min)   GFR calc Af Amer >90  >90 (mL/min)    SignedDonnalee Curry C 06/09/2011, 1:17 PM

## 2011-06-09 NOTE — Progress Notes (Signed)
Cm spoke with pt with son at bedside concerning MD order for HHPT/OT. Per pt choice [facility] to provide [facility] services. Pt has DME at home. Cm gave pt and son PCP list for follow appt. Pt's MD recently retired.

## 2014-04-02 ENCOUNTER — Emergency Department (HOSPITAL_COMMUNITY)
Admission: EM | Admit: 2014-04-02 | Discharge: 2014-04-03 | Disposition: A | Discharge status: Home / Self Care | Payer: Medicare Other | Attending: Emergency Medicine | Admitting: Emergency Medicine

## 2014-04-02 ENCOUNTER — Emergency Department (HOSPITAL_COMMUNITY): Payer: Medicare Other

## 2014-04-02 ENCOUNTER — Encounter (HOSPITAL_COMMUNITY): Payer: Self-pay | Admitting: Emergency Medicine

## 2014-04-02 DIAGNOSIS — W19XXXA Unspecified fall, initial encounter: Secondary | ICD-10-CM

## 2014-04-02 DIAGNOSIS — K0820 Unspecified atrophy of edentulous alveolar ridge: Secondary | ICD-10-CM | POA: Insufficient documentation

## 2014-04-02 DIAGNOSIS — Y9389 Activity, other specified: Secondary | ICD-10-CM | POA: Insufficient documentation

## 2014-04-02 DIAGNOSIS — Z79899 Other long term (current) drug therapy: Secondary | ICD-10-CM | POA: Insufficient documentation

## 2014-04-02 DIAGNOSIS — R296 Repeated falls: Secondary | ICD-10-CM | POA: Insufficient documentation

## 2014-04-02 DIAGNOSIS — Y9289 Other specified places as the place of occurrence of the external cause: Secondary | ICD-10-CM | POA: Insufficient documentation

## 2014-04-02 DIAGNOSIS — Z8781 Personal history of (healed) traumatic fracture: Secondary | ICD-10-CM | POA: Diagnosis not present

## 2014-04-02 DIAGNOSIS — J449 Chronic obstructive pulmonary disease, unspecified: Secondary | ICD-10-CM | POA: Diagnosis not present

## 2014-04-02 DIAGNOSIS — Z880 Allergy status to penicillin: Secondary | ICD-10-CM | POA: Diagnosis not present

## 2014-04-02 DIAGNOSIS — Z8639 Personal history of other endocrine, nutritional and metabolic disease: Secondary | ICD-10-CM | POA: Insufficient documentation

## 2014-04-02 DIAGNOSIS — Z8739 Personal history of other diseases of the musculoskeletal system and connective tissue: Secondary | ICD-10-CM | POA: Insufficient documentation

## 2014-04-02 DIAGNOSIS — Z862 Personal history of diseases of the blood and blood-forming organs and certain disorders involving the immune mechanism: Secondary | ICD-10-CM | POA: Diagnosis not present

## 2014-04-02 DIAGNOSIS — I10 Essential (primary) hypertension: Secondary | ICD-10-CM | POA: Diagnosis not present

## 2014-04-02 DIAGNOSIS — IMO0002 Reserved for concepts with insufficient information to code with codable children: Secondary | ICD-10-CM | POA: Insufficient documentation

## 2014-04-02 DIAGNOSIS — R197 Diarrhea, unspecified: Secondary | ICD-10-CM | POA: Diagnosis not present

## 2014-04-02 DIAGNOSIS — J4489 Other specified chronic obstructive pulmonary disease: Secondary | ICD-10-CM | POA: Insufficient documentation

## 2014-04-02 LAB — CBC WITH DIFFERENTIAL/PLATELET
BASOS ABS: 0 10*3/uL (ref 0.0–0.1)
Basophils Relative: 0 % (ref 0–1)
EOS PCT: 0 % (ref 0–5)
Eosinophils Absolute: 0 10*3/uL (ref 0.0–0.7)
HCT: 37.1 % (ref 36.0–46.0)
Hemoglobin: 11.8 g/dL — ABNORMAL LOW (ref 12.0–15.0)
Lymphocytes Relative: 21 % (ref 12–46)
Lymphs Abs: 2.1 10*3/uL (ref 0.7–4.0)
MCH: 23.4 pg — ABNORMAL LOW (ref 26.0–34.0)
MCHC: 31.8 g/dL (ref 30.0–36.0)
MCV: 73.5 fL — AB (ref 78.0–100.0)
Monocytes Absolute: 1.1 10*3/uL — ABNORMAL HIGH (ref 0.1–1.0)
Monocytes Relative: 11 % (ref 3–12)
NEUTROS PCT: 68 % (ref 43–77)
Neutro Abs: 6.6 10*3/uL (ref 1.7–7.7)
PLATELETS: 341 10*3/uL (ref 150–400)
RBC: 5.05 MIL/uL (ref 3.87–5.11)
RDW: 16.2 % — AB (ref 11.5–15.5)
WBC: 9.8 10*3/uL (ref 4.0–10.5)

## 2014-04-02 LAB — URINALYSIS, ROUTINE W REFLEX MICROSCOPIC
Bilirubin Urine: NEGATIVE
Glucose, UA: 100 mg/dL — AB
Hgb urine dipstick: NEGATIVE
KETONES UR: NEGATIVE mg/dL
Leukocytes, UA: NEGATIVE
NITRITE: NEGATIVE
PROTEIN: NEGATIVE mg/dL
Specific Gravity, Urine: 1.017 (ref 1.005–1.030)
Urobilinogen, UA: 0.2 mg/dL (ref 0.0–1.0)
pH: 7 (ref 5.0–8.0)

## 2014-04-02 LAB — COMPREHENSIVE METABOLIC PANEL
ALT: 14 U/L (ref 0–35)
ANION GAP: 15 (ref 5–15)
AST: 27 U/L (ref 0–37)
Albumin: 3.5 g/dL (ref 3.5–5.2)
Alkaline Phosphatase: 92 U/L (ref 39–117)
BILIRUBIN TOTAL: 0.3 mg/dL (ref 0.3–1.2)
BUN: 20 mg/dL (ref 6–23)
CALCIUM: 9.5 mg/dL (ref 8.4–10.5)
CHLORIDE: 94 meq/L — AB (ref 96–112)
CO2: 25 mEq/L (ref 19–32)
CREATININE: 0.5 mg/dL (ref 0.50–1.10)
GFR, EST NON AFRICAN AMERICAN: 78 mL/min — AB (ref >90)
GLUCOSE: 149 mg/dL — AB (ref 70–99)
Potassium: 4.2 mEq/L (ref 3.7–5.3)
Sodium: 134 mEq/L — ABNORMAL LOW (ref 137–147)
Total Protein: 7.7 g/dL (ref 6.0–8.3)

## 2014-04-02 MED ORDER — ACETAMINOPHEN 325 MG PO TABS
650.0000 mg | ORAL_TABLET | Freq: Once | ORAL | Status: AC
  Administered 2014-04-03: 650 mg via ORAL
  Filled 2014-04-02: qty 2

## 2014-04-02 NOTE — ED Provider Notes (Addendum)
CSN: [id number]     Arrival date & time 04/02/14  2140 History   None    Chief Complaint  Patient presents with  . Fall     (Consider location/radiation/quality/duration/timing/severity/associated sxs/prior Treatment) HPI Level V caveat patient has poor memory short-term and long-term memory. Patient reportedly fell this evening. She did not recall a time. She complains of low back pain since the event. She was brought by EMS after she activated her life call alert. She denies headache denies neck pain denies abdominal pain no other complaint. Caregiver who accompanies her states she is prone to falls. Walks with cane. No treatment prior to coming Past Medical History  Diagnosis Date  . Hypertension   . Compression fracture of thoracic vertebra   . Osteoporosis   . Carotid artery bruit   . Orthostasis   . Aortic stenosis, mild 06/2007    per echo of 06/17/2007  . COPD (chronic obstructive pulmonary disease) 12/2003    per CT chest 12/10/2003  . Interstitial lung disease     per CT of 12/10/2003  . Dizziness   . Fall   . Dehydration with hyponatremia 06/2007  . Elevation of cardiac enzymes 06/2007   Past Surgical History  Procedure Laterality Date  . Abdominal hysterectomy    . Eye surgery      cataract surgery   History reviewed. No pertinent family history. History  Substance Use Topics  . Smoking status: Never Smoker   . Smokeless tobacco: Never Used  . Alcohol Use: No   OB History   Grav Para Term Preterm Abortions TAB SAB Ect Mult Living                 Review of Systems  Constitutional: Negative.   HENT: Negative.   Respiratory: Negative.   Cardiovascular: Negative.   Gastrointestinal: Positive for diarrhea.       Diarrhea for one year  Musculoskeletal: Positive for gait problem.       Walks with cane  Skin: Negative.   Neurological: Negative.   Psychiatric/Behavioral: Negative.   All other systems reviewed and are negative.     Allergies   Penicillins  Home Medications   Prior to Admission medications   Medication Sig Start Date End Date Taking? Authorizing Provider  saccharomyces boulardii ([employer] DIGESTIVE PROBIOTIC) 250 MG capsule Take 250 mg by mouth 2 (two) times daily.   Yes Historical Provider, MD  amLODipine (NORVASC) 5 MG tablet Take 1 tablet (5 mg total) by mouth daily. 06/09/11 06/08/12  Kela Millin, MD   BP 187/84  Pulse 106  Temp(Src) 97.9 F (36.6 C) (Oral)  Resp 18  SpO2 94% Physical Exam  Nursing note and vitals reviewed. Constitutional: She appears well-developed and well-nourished. No distress.  HENT:  Head: Normocephalic and atraumatic.  Edentulous  Eyes: Conjunctivae are normal. Pupils are equal, round, and reactive to light.  Neck: Neck supple. No tracheal deviation present. No thyromegaly present.  Cardiovascular: Normal rate and regular rhythm.   No murmur heard. Pulmonary/Chest: Effort normal and breath sounds normal.  Abdominal: Soft. Bowel sounds are normal. She exhibits no distension. There is no tenderness.  Musculoskeletal: Normal range of motion. She exhibits no edema and no tenderness.  Neurological: She is alert. No cranial nerve deficit. Coordination normal.  Skin: Skin is warm and dry. No rash noted.  Psychiatric: She has a normal mood and affect.   ED Course  Procedures (including critical care time) Labs Review Labs Reviewed - No data  to display  Imaging Review No results found.   EKG Interpretation   Date/Time:  Friday April 02 2014 23:06:56 EDT Ventricular Rate:  105 PR Interval:  184 QRS Duration: 80 QT Interval:  337 QTC Calculation: 445 R Axis:   43 Text Interpretation:  Sinus tachycardia Probable LVH with secondary repol  abnrm No significant change since last tracing Confirmed by Kassidy Dockendorf   MD, Myria Steenbergen [phone]) on 04/03/2014 12:02:06 AM     X-rays viewed by me 12:20 a.m. patient states "I feel fine" she appears comfortable. xrays viewed by me Results  for orders placed during the hospital encounter of 04/02/14  COMPREHENSIVE METABOLIC PANEL      Result Value Ref Range   Sodium 134 (*) 137 - 147 mEq/L   Potassium 4.2  3.7 - 5.3 mEq/L   Chloride 94 (*) 96 - 112 mEq/L   CO2 25  19 - 32 mEq/L   Glucose, Bld 149 (*) 70 - 99 mg/dL   BUN 20  6 - 23 mg/dL   Creatinine, Ser 6.04  0.50 - 1.10 mg/dL   Calcium 9.5  8.4 - 54.0 mg/dL   Total Protein 7.7  6.0 - 8.3 g/dL   Albumin 3.5  3.5 - 5.2 g/dL   AST 27  0 - 37 U/L   ALT 14  0 - 35 U/L   Alkaline Phosphatase 92  39 - 117 U/L   Total Bilirubin 0.3  0.3 - 1.2 mg/dL   GFR calc non Af Amer 78 (*) >90 mL/min   GFR calc Af Amer >90  >90 mL/min   Anion gap 15  5 - 15  CBC WITH DIFFERENTIAL      Result Value Ref Range   WBC 9.8  4.0 - 10.5 K/uL   RBC 5.05  3.87 - 5.11 MIL/uL   Hemoglobin 11.8 (*) 12.0 - 15.0 g/dL   HCT 98.1  19.1 - 47.8 %   MCV 73.5 (*) 78.0 - 100.0 fL   MCH 23.4 (*) 26.0 - 34.0 pg   MCHC 31.8  30.0 - 36.0 g/dL   RDW 29.5 (*) 62.1 - 30.8 %   Platelets 341  150 - 400 K/uL   Neutrophils Relative % 68  43 - 77 %   Neutro Abs 6.6  1.7 - 7.7 K/uL   Lymphocytes Relative 21  12 - 46 %   Lymphs Abs 2.1  0.7 - 4.0 K/uL   Monocytes Relative 11  3 - 12 %   Monocytes Absolute 1.1 (*) 0.1 - 1.0 K/uL   Eosinophils Relative 0  0 - 5 %   Eosinophils Absolute 0.0  0.0 - 0.7 K/uL   Basophils Relative 0  0 - 1 %   Basophils Absolute 0.0  0.0 - 0.1 K/uL  URINALYSIS, ROUTINE W REFLEX MICROSCOPIC      Result Value Ref Range   Color, Urine YELLOW  YELLOW   APPearance CLEAR  CLEAR   Specific Gravity, Urine 1.017  1.005 - 1.030   pH 7.0  5.0 - 8.0   Glucose, UA 100 (*) NEGATIVE mg/dL   Hgb urine dipstick NEGATIVE  NEGATIVE   Bilirubin Urine NEGATIVE  NEGATIVE   Ketones, ur NEGATIVE  NEGATIVE mg/dL   Protein, ur NEGATIVE  NEGATIVE mg/dL   Urobilinogen, UA 0.2  0.0 - 1.0 mg/dL   Nitrite NEGATIVE  NEGATIVE   Leukocytes, UA NEGATIVE  NEGATIVE   Dg Thoracic Spine 2 View  04/03/2014    CLINICAL DATA:  Fall.  Pain all over thoracic and lumbar spine.  EXAM: THORACIC SPINE - 2 VIEW  COMPARISON:  Chest 06/04/2011  FINDINGS: Thoracic scoliosis convex towards the right. Diffuse bone demineralization. Multiple thoracic compression fractures. There is anterior compression of a mid thoracic vertebra, likely T7, with associated thoracic kyphosis. There is suggestion of some retropulsion of fracture fragments although overlap of the scapula limits this evaluation. Multiple additional compression fractures of the thoracic spine. Fractures are age indeterminate but there appears to be progression since previous study from 2012. No paraspinal soft tissue swelling. Extensive vascular calcifications. Possible perihilar infiltrates in the lungs.  IMPRESSION: Diffuse bone demineralization with multiple thoracic compression fractures consistent with osteoporosis. Fractures are age indeterminate but there has been some progression since previous study of 2012.   Electronically Signed   By: Burman Nieves M.D.   On: 04/03/2014 00:09   Dg Lumbar Spine Complete  04/03/2014   CLINICAL DATA:  Fall, pain  EXAM: LUMBAR SPINE - COMPLETE 4+ VIEW  COMPARISON:  None.  FINDINGS: There are 5 nonrib bearing lumbar-type vertebral bodies. There are age indeterminate vertebral body compression fractures extending from T12 through L3. There are chronic compression fractures of the L4 and L5 vertebral bodies. The alignment is anatomic. There is no spondylolysis. There is no static listhesis. There is degenerative disc disease at L5-S1. There is facet arthropathy throughout the lumbar spine.  The SI joints are unremarkable.  There is abdominal aortic atherosclerosis.  IMPRESSION: Age indeterminate vertebral body compression fractures extending from T12 through L3.   Electronically Signed   By: Elige Ko   On: 04/03/2014 00:07    MDM  I had a lengthy discussion with the patient's son Roland Kurowski via telephone. He is okay with  patient going home. Patient wants to go home. I feel that patient may need increase assistance at home or possible placement in assisted living. He currently has a life alert which she pushed tonight. She does have an appointment with her primary care physician next week Diagnosis #1 fall #2 compression fracture of lumbar and thoracic spine  Final diagnoses:  None        Doug Sou, MD 04/03/14 1610  Doug Sou, MD 04/03/14 0104

## 2014-04-02 NOTE — ED Notes (Signed)
EMS called to home.Patient is alert and oriented x3.  She states that she had falled at home tonight. Patient states that she does not know how she fell and does not remember falling.  Patient does not  Have any obvious head injuries or pain in the head area.  Patient does complain of lower back pain.   Currently she rates her pain 4 of 10.

## 2014-04-03 NOTE — ED Notes (Signed)
Patient is alert and oriented x3.  She was given DC instructions and follow up visit instructions.  Patient gave verbal understanding. She was DC ambulatory under her own power to home.  V/S stable.  He was not showing any signs of distress on DC 

## 2014-04-03 NOTE — Discharge Instructions (Signed)
Fall Prevention and Home Safety Your x-rays show that you have several compression fractures of your spine, likely secondary to osteoporosis. Take Tylenol as directed for pain. Keep your scheduled appointment with your primary care physician next week. Discuss with your physician but you may need more help at home or possibly to live in assisted-living. Use your life alert button immediately if you should fall again  Falls cause injuries and can affect all age groups. It is possible to prevent falls.  HOW TO PREVENT FALLS  Wear shoes with rubber soles that do not have an opening for your toes.  Keep the inside and outside of your house well lit.  Use night lights throughout your home.  Remove clutter from floors.  Clean up floor spills.  Remove throw rugs or fasten them to the floor with carpet tape.  Do not place electrical cords across pathways.  Put grab bars by your tub, shower, and toilet. Do not use towel bars as grab bars.  Put handrails on both sides of the stairway. Fix loose handrails.  Do not climb on stools or stepladders, if possible.  Do not wax your floors.  Repair uneven or unsafe sidewalks, walkways, or stairs.  Keep items you use a lot within reach.  Be aware of pets.  Keep emergency numbers next to the telephone.  Put smoke detectors in your home and near bedrooms. Ask your doctor what other things you can do to prevent falls. Document Released: 04/21/2009 Document Revised: 12/25/2011 Document Reviewed: 09/25/2011 [facility] Patient Information 2015 [place], [place]. This information is not intended to replace advice given to you by your health care provider. Make sure you discuss any questions you have with your health care provider.

## 2015-01-07 DEATH — deceased

## 2015-06-29 IMAGING — CR DG LUMBAR SPINE COMPLETE 4+V
5 series · 5 of 5 positions shown · non-contrast
Comparison: None.

CLINICAL DATA: Fall, pain

EXAM:
LUMBAR SPINE - COMPLETE 4+ VIEW

[t lumbar spine ap]
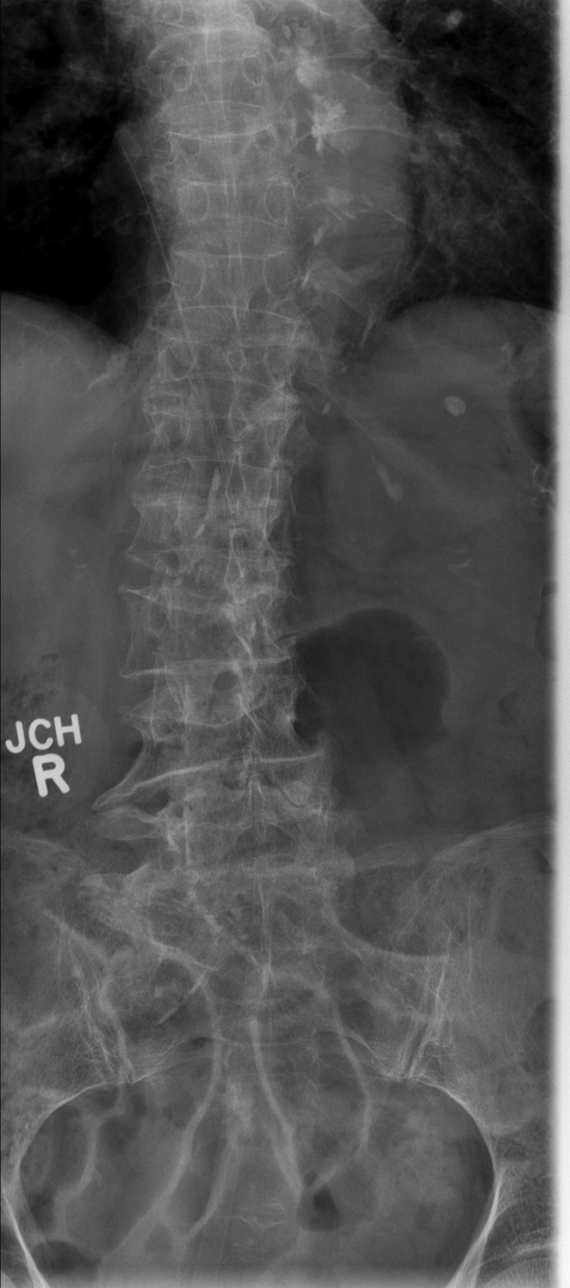

[t lumbar spine obl (1 of 2)]
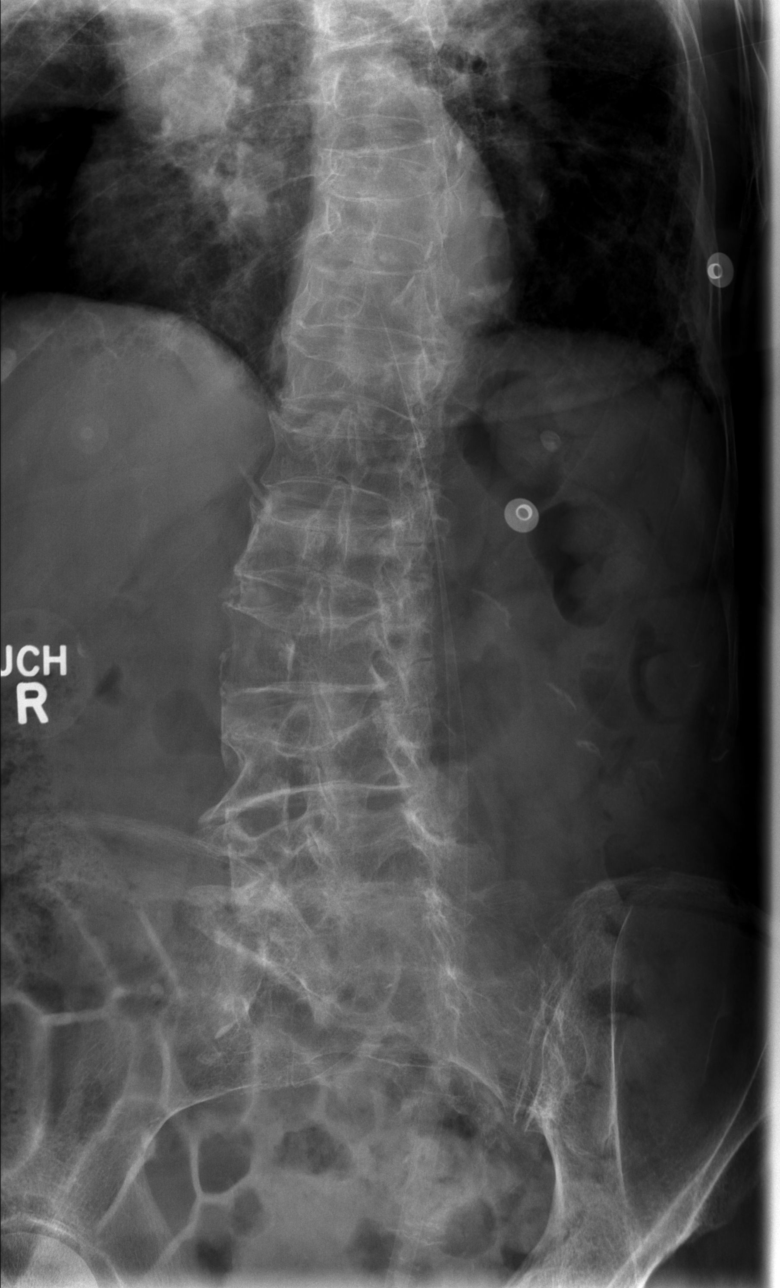

[t lumbar spine obl (2 of 2)]
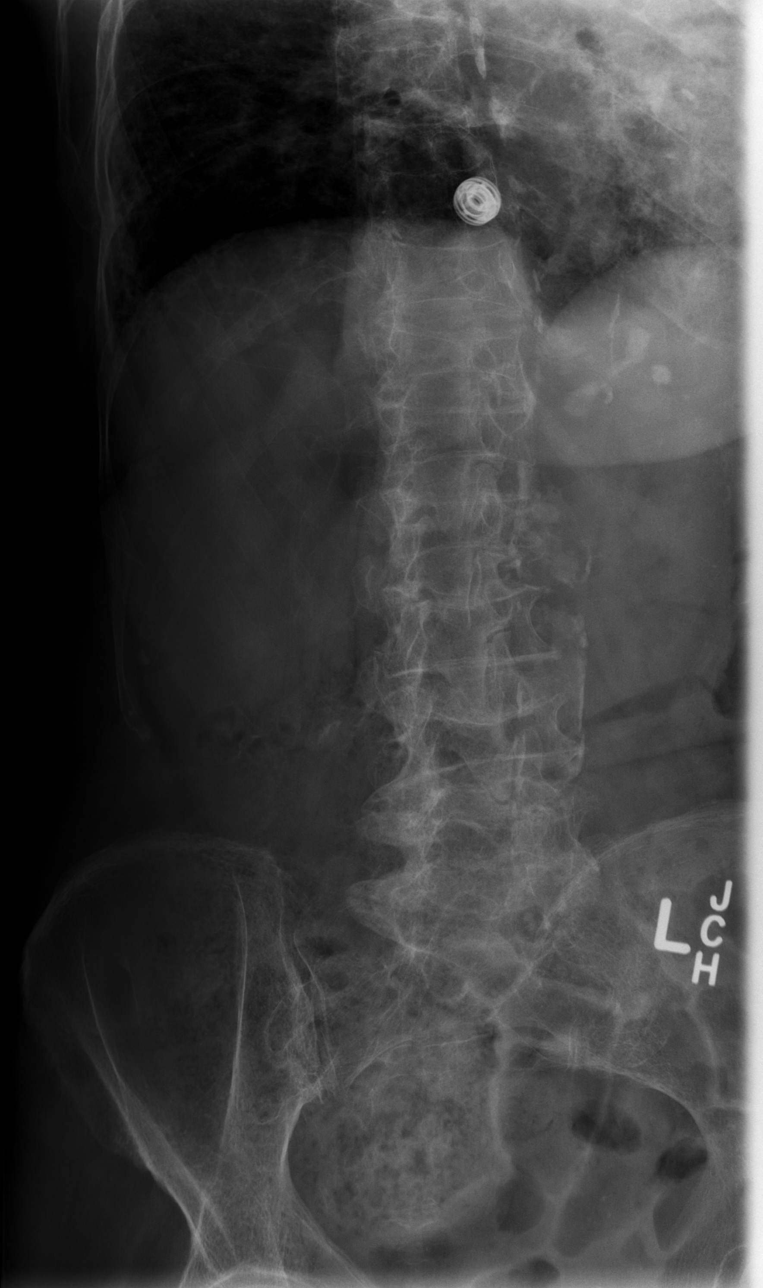

[t lumbar spine lat]
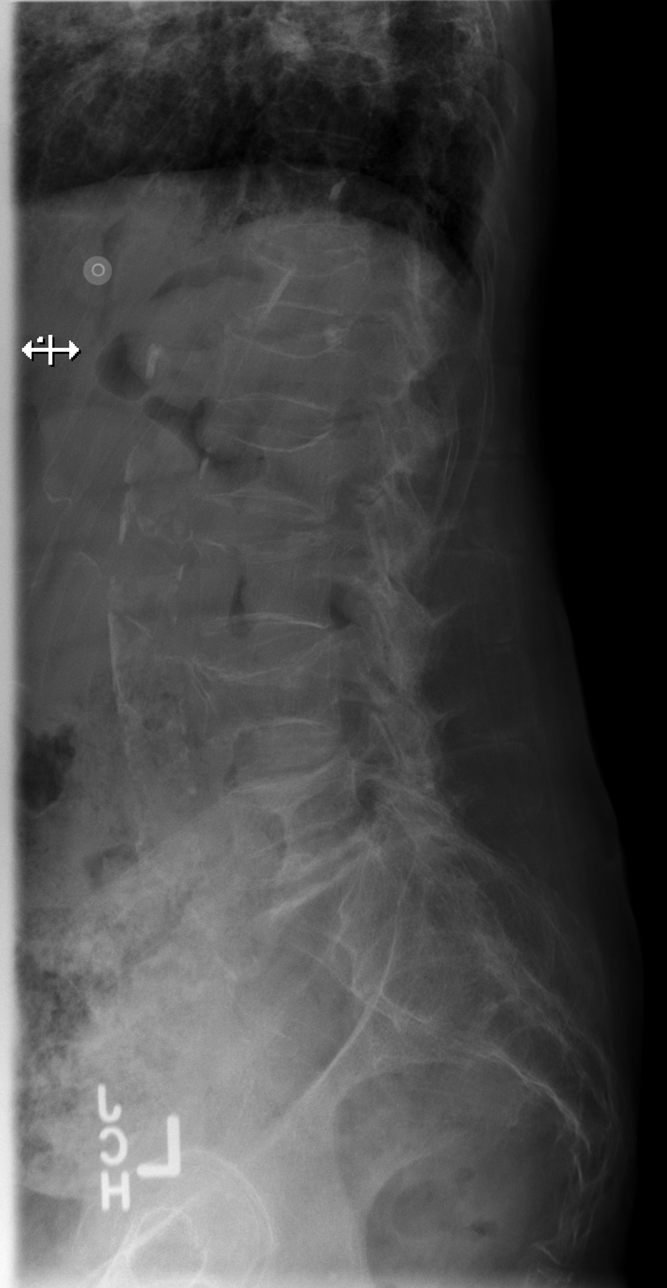

[t lumbar l-5 s-1 spot]
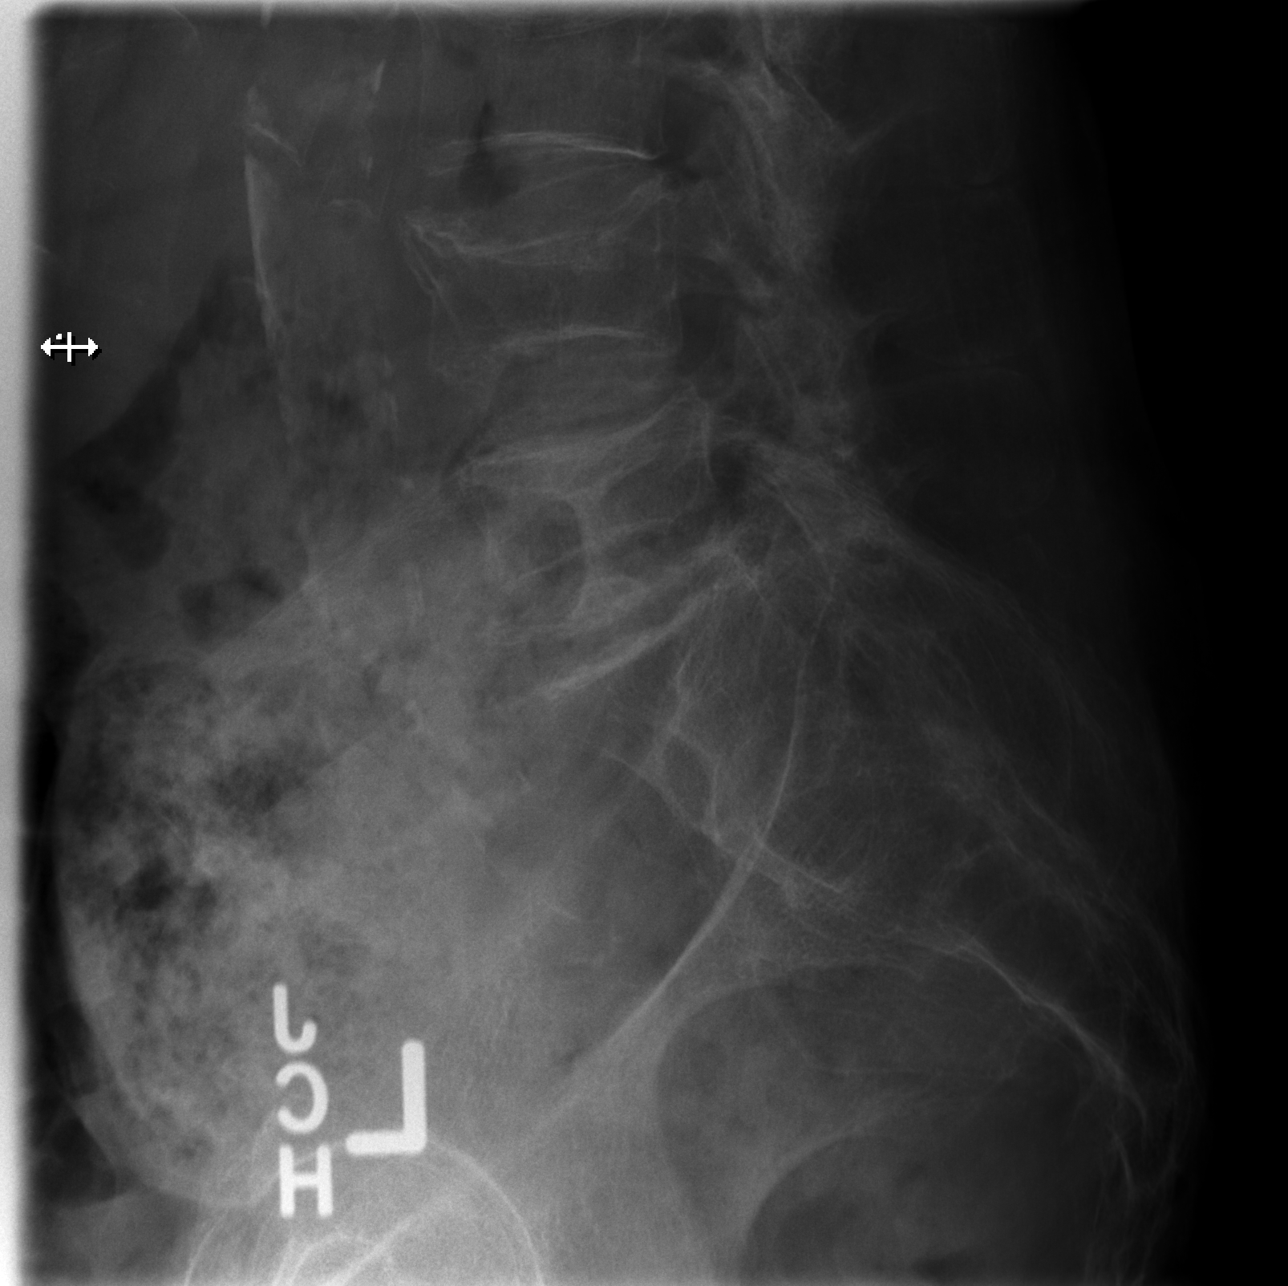

[5 of 5 positions shown; findings below may reference images not displayed]

FINDINGS: There are 5 nonrib bearing lumbar-type vertebral bodies. There are
age indeterminate vertebral body compression fractures extending
from T12 through L3. There are chronic compression fractures of the
L4 and L5 vertebral bodies. The alignment is anatomic. There is no
spondylolysis. There is no static listhesis. There is degenerative
disc disease at L5-S1. There is facet arthropathy throughout the
lumbar spine.

The SI joints are unremarkable.

There is abdominal aortic atherosclerosis.
IMPRESSION: Age indeterminate vertebral body compression fractures extending
from T12 through L3.
